# Patient Record
Sex: Female | Born: 1985 | Race: Black or African American | Hispanic: No | Marital: Married
Health system: Midwestern US, Community
[De-identification: ages and names within clinical notes are randomized; demographics above are authoritative.]

## PROBLEM LIST (undated history)

## (undated) DIAGNOSIS — J45998 Other asthma: Secondary | ICD-10-CM

## (undated) DIAGNOSIS — T7840XA Allergy, unspecified, initial encounter: Secondary | ICD-10-CM

## (undated) HISTORY — DX: Allergy, unspecified, initial encounter: T78.40XA

## (undated) HISTORY — PX: HERNIA REPAIR: SHX51

---

## 2007-01-27 ENCOUNTER — Emergency Department (HOSPITAL_COMMUNITY): Admission: EM | Admit: 2007-01-27 | Discharge: 2007-01-27 | Payer: Self-pay | Admitting: Family Medicine

## 2009-01-19 ENCOUNTER — Emergency Department (HOSPITAL_COMMUNITY): Admission: EM | Admit: 2009-01-19 | Discharge: 2009-01-19 | Payer: Self-pay | Admitting: Emergency Medicine

## 2009-07-07 ENCOUNTER — Emergency Department (HOSPITAL_COMMUNITY): Admission: EM | Admit: 2009-07-07 | Discharge: 2009-07-08 | Payer: Self-pay | Admitting: Emergency Medicine

## 2010-10-12 ENCOUNTER — Emergency Department (HOSPITAL_COMMUNITY)
Admission: EM | Admit: 2010-10-12 | Discharge: 2010-10-13 | Disposition: A | Discharge status: Home / Self Care | Payer: Self-pay | Attending: Emergency Medicine | Admitting: Emergency Medicine

## 2010-10-12 DIAGNOSIS — O99891 Other specified diseases and conditions complicating pregnancy: Secondary | ICD-10-CM | POA: Insufficient documentation

## 2010-10-12 DIAGNOSIS — R319 Hematuria, unspecified: Secondary | ICD-10-CM | POA: Insufficient documentation

## 2010-10-12 DIAGNOSIS — M549 Dorsalgia, unspecified: Secondary | ICD-10-CM | POA: Insufficient documentation

## 2010-10-12 DIAGNOSIS — N390 Urinary tract infection, site not specified: Secondary | ICD-10-CM | POA: Insufficient documentation

## 2010-10-12 DIAGNOSIS — O2390 Unspecified genitourinary tract infection in pregnancy, unspecified trimester: Secondary | ICD-10-CM | POA: Insufficient documentation

## 2010-10-12 LAB — URINALYSIS, ROUTINE W REFLEX MICROSCOPIC
Bilirubin Urine: NEGATIVE
Ketones, ur: NEGATIVE mg/dL
Nitrite: NEGATIVE
Specific Gravity, Urine: 1.02 (ref 1.005–1.030)

## 2010-10-12 LAB — URINE MICROSCOPIC-ADD ON

## 2010-10-12 LAB — WET PREP, GENITAL: Clue Cells Wet Prep HPF POC: NONE SEEN

## 2010-10-12 LAB — PREGNANCY, URINE: Preg Test, Ur: POSITIVE

## 2010-10-13 ENCOUNTER — Emergency Department (HOSPITAL_COMMUNITY): Payer: Self-pay

## 2010-10-13 LAB — GC/CHLAMYDIA PROBE AMP, GENITAL
Chlamydia, DNA Probe: NEGATIVE
GC Probe Amp, Genital: NEGATIVE

## 2010-10-14 LAB — URINE CULTURE

## 2011-05-05 HISTORY — PX: OTHER SURGICAL HISTORY: SHX169

## 2013-09-19 NOTE — Other (Signed)
HPI Comments: Struck right head against table 12 hours ago    Patient is a 28 y.o. female presenting with skin laceration. The history is provided by the patient.   Laceration   The incident occurred 12 to 24 hours ago. The laceration is located on the face. The laceration is 1 cm in size. The injury mechanism is a blunt object. Foreign body present: no. The pain is mild. The pain has been improving since onset. Pertinent negatives include no numbness, no tingling, no weakness, no loss of motion, no coolness and no discoloration. The patient's last tetanus shot was less than 5 years ago.       No past medical history on file.     No past surgical history on file.      No family history on file.     History     Social History   ??? Marital Status: MARRIED     Spouse Name: N/A     Number of Children: N/A   ??? Years of Education: N/A     Occupational History   ??? Not on file.     Social History Main Topics   ??? Smoking status: Not on file   ??? Smokeless tobacco: Not on file   ??? Alcohol Use: Not on file   ??? Drug Use: Not on file   ??? Sexual Activity: Not on file     Other Topics Concern   ??? Not on file     Social History Narrative   ??? No narrative on file                ALLERGIES: Shellfish derived    Review of Systems   Constitutional: Negative for fever and chills.   Skin: Positive for wound.   Neurological: Negative for tingling, weakness and numbness.       Filed Vitals:    09/19/13 1704   BP: 129/69   Pulse: 65   Temp: 98.8 ??F (37.1 ??C)   Resp: 18   Weight: 90.719 kg (200 lb)   SpO2: 99%       Physical Exam   Constitutional: She appears well-developed and well-nourished. No distress.   Neurological: She is alert.   Skin: Laceration noted. She is not diaphoretic.        Psychiatric: She has a normal mood and affect. Her behavior is normal. Judgment and thought content normal.   Nursing note and vitals reviewed.      MDM    Procedures

## 2013-09-19 NOTE — Other (Signed)
Wound cleaned and steri strips applied

## 2013-09-19 NOTE — Other (Signed)
TD <5 yrs per pt.

## 2014-07-04 ENCOUNTER — Emergency Department (HOSPITAL_COMMUNITY): Payer: Managed Care, Other (non HMO)

## 2014-07-04 ENCOUNTER — Emergency Department (HOSPITAL_COMMUNITY)
Admission: EM | Admit: 2014-07-04 | Discharge: 2014-07-04 | Disposition: A | Discharge status: Home / Self Care | Payer: Managed Care, Other (non HMO) | Attending: Emergency Medicine | Admitting: Emergency Medicine

## 2014-07-04 ENCOUNTER — Encounter (HOSPITAL_COMMUNITY): Payer: Self-pay | Admitting: Emergency Medicine

## 2014-07-04 DIAGNOSIS — R0602 Shortness of breath: Secondary | ICD-10-CM | POA: Diagnosis present

## 2014-07-04 DIAGNOSIS — J45901 Unspecified asthma with (acute) exacerbation: Secondary | ICD-10-CM | POA: Diagnosis not present

## 2014-07-04 DIAGNOSIS — R2240 Localized swelling, mass and lump, unspecified lower limb: Secondary | ICD-10-CM | POA: Insufficient documentation

## 2014-07-04 DIAGNOSIS — Z79899 Other long term (current) drug therapy: Secondary | ICD-10-CM | POA: Diagnosis not present

## 2014-07-04 DIAGNOSIS — R0682 Tachypnea, not elsewhere classified: Secondary | ICD-10-CM | POA: Diagnosis not present

## 2014-07-04 DIAGNOSIS — J9801 Acute bronchospasm: Secondary | ICD-10-CM

## 2014-07-04 HISTORY — DX: Other asthma: J45.998

## 2014-07-04 LAB — CBC WITH DIFFERENTIAL/PLATELET
BASOS ABS: 0 10*3/uL (ref 0.0–0.1)
Basophils Relative: 0 % (ref 0–1)
Eosinophils Absolute: 0.1 10*3/uL (ref 0.0–0.7)
Eosinophils Relative: 4 % (ref 0–5)
HCT: 43.7 % (ref 36.0–46.0)
HEMOGLOBIN: 14.5 g/dL (ref 12.0–15.0)
LYMPHS ABS: 2 10*3/uL (ref 0.7–4.0)
LYMPHS PCT: 51 % — AB (ref 12–46)
MCH: 27.3 pg (ref 26.0–34.0)
MCHC: 33.2 g/dL (ref 30.0–36.0)
MCV: 82.3 fL (ref 78.0–100.0)
MONOS PCT: 4 % (ref 3–12)
Monocytes Absolute: 0.2 10*3/uL (ref 0.1–1.0)
NEUTROS ABS: 1.6 10*3/uL — AB (ref 1.7–7.7)
NEUTROS PCT: 41 % — AB (ref 43–77)
Platelets: 196 10*3/uL (ref 150–400)
RBC: 5.31 MIL/uL — AB (ref 3.87–5.11)
RDW: 12.7 % (ref 11.5–15.5)
WBC: 3.9 10*3/uL — ABNORMAL LOW (ref 4.0–10.5)

## 2014-07-04 LAB — BASIC METABOLIC PANEL
ANION GAP: 9 (ref 5–15)
BUN: 15 mg/dL (ref 6–23)
CHLORIDE: 106 mmol/L (ref 96–112)
CO2: 23 mmol/L (ref 19–32)
Calcium: 9.5 mg/dL (ref 8.4–10.5)
Creatinine, Ser: 0.82 mg/dL (ref 0.50–1.10)
GFR calc Af Amer: >90 mL/min (ref >90)
GFR calc non Af Amer: >90 mL/min (ref >90)
Glucose, Bld: 80 mg/dL (ref 70–99)
Potassium: 3.3 mmol/L — ABNORMAL LOW (ref 3.5–5.1)
SODIUM: 138 mmol/L (ref 135–145)

## 2014-07-04 LAB — BRAIN NATRIURETIC PEPTIDE: B NATRIURETIC PEPTIDE 5: 20 pg/mL (ref 0.0–100.0)

## 2014-07-04 LAB — D-DIMER, QUANTITATIVE (NOT AT ARMC): D DIMER QUANT: 0.56 ug{FEU}/mL — AB (ref 0.00–0.48)

## 2014-07-04 LAB — PREGNANCY, URINE: Preg Test, Ur: NEGATIVE

## 2014-07-04 MED ORDER — PREDNISONE 50 MG PO TABS: ORAL_TABLET | ORAL | Status: DC

## 2014-07-04 MED ORDER — PREDNISONE 10 MG PO TABS
60.0000 mg | ORAL_TABLET | Freq: Once | ORAL | Status: AC
  Administered 2014-07-04: 60 mg via ORAL
  Filled 2014-07-04 (×2): qty 1

## 2014-07-04 MED ORDER — IOHEXOL 350 MG/ML SOLN
100.0000 mL | Freq: Once | INTRAVENOUS | Status: AC | PRN
  Administered 2014-07-04: 100 mL via INTRAVENOUS

## 2014-07-04 MED ORDER — IPRATROPIUM-ALBUTEROL 0.5-2.5 (3) MG/3ML IN SOLN
3.0000 mL | Freq: Once | RESPIRATORY_TRACT | Status: AC
  Administered 2014-07-04: 3 mL via RESPIRATORY_TRACT
  Filled 2014-07-04: qty 3

## 2014-07-04 MED ORDER — ALBUTEROL SULFATE HFA 108 (90 BASE) MCG/ACT IN AERS: 2.0000 | INHALATION_SPRAY | Freq: Four times a day (QID) | RESPIRATORY_TRACT | Status: DC | PRN

## 2014-07-04 NOTE — ED Provider Notes (Signed)
CSN: [id number]     Arrival date & time 07/04/14  1317 History   First MD Initiated Contact with Patient 07/04/14 1328     Chief Complaint  Patient presents with  . Shortness of Breath     (Consider location/radiation/quality/duration/timing/severity/associated sxs/prior Treatment) HPI Comments: Obese female with history of "seasonal asthma" nonsmoker presenting or shortness of breath that worsened this morning while she was picking up her son from school. She feels like she can't catch her breath has chest tightness,.. Her husband at bedside states she's been having difficulty breathing for the past 3 days especially at night which patient denies and says she feels fine during the day. She is dyspneic with conversation but in no distress. She denies any recent URI type symptoms but has had a dry cough. Denies any abdominal pain nausea or vomiting. Reports no cardiac history. She is on Mirena birth control. She has never been hospitalized for asthma and does not use any asthma medications at home.  The history is provided by the spouse and the patient. The history is limited by the condition of the patient.    Past Medical History  Diagnosis Date  . Seasonal asthma    Past Surgical History  Procedure Laterality Date  . Hernia repair     Family History  Problem Relation Age of Onset  . Cancer Other    History  Substance Use Topics  . Smoking status: Never Smoker   . Smokeless tobacco: Never Used  . Alcohol Use: No   OB History    Gravida Para Term Preterm AB TAB SAB Ectopic Multiple Living   Review of Systems  Constitutional: Negative for fever, activity change and appetite change.  Eyes: Negative for visual disturbance.  Respiratory: Positive for cough, chest tightness and shortness of breath.   Cardiovascular: Positive for leg swelling. Negative for chest pain.  Gastrointestinal: Negative for nausea, vomiting and abdominal pain.  Genitourinary: Negative  for dysuria and hematuria.  Musculoskeletal: Negative for myalgias and arthralgias.  Neurological: Negative for dizziness, weakness, light-headedness and headaches.  A complete 10 system review of systems was obtained and all systems are negative except as noted in the HPI and PMH.      Allergies  Shellfish allergy  Home Medications   Prior to Admission medications   Medication Sig Start Date End Date Taking? Authorizing Provider  levonorgestrel (MIRENA) 20 MCG/24HR IUD 1 each by Intrauterine route once.   Yes Historical Provider, MD  albuterol (PROVENTIL HFA;VENTOLIN HFA) 108 (90 BASE) MCG/ACT inhaler Inhale 2 puffs into the lungs every 6 (six) hours as needed for wheezing or shortness of breath. 07/04/14   Glynn Octave, MD  predniSONE (DELTASONE) 50 MG tablet 1 tablet PO daily 07/04/14   Glynn Octave, MD   BP 109/77 mmHg  Pulse 55  Temp(Src) 97.9 F (36.6 C) (Oral)  Resp 14  Ht  (1.575 m)  Wt 200 lb (90.719 kg)  BMI 36.57 kg/m2  SpO2 100%  LMP 06/20/2014 Physical Exam  Constitutional: She is oriented to person, place, and time. She appears well-developed and well-nourished. She appears distressed.  Tachypnea, increased work of breathing, dyspneic with conversation, speaking in short phrases  HENT:  Head: Normocephalic and atraumatic.  Mouth/Throat: Oropharynx is clear and moist. No oropharyngeal exudate.  Eyes: Conjunctivae and EOM are normal. Pupils are equal, round, and reactive to light.  Neck: Normal range of motion. Neck supple.  No meningismus.  Cardiovascular: Normal rate, regular rhythm, normal heart sounds and intact distal pulses.   No murmur heard. Pulmonary/Chest: Effort normal and breath sounds normal. No respiratory distress. She has no wheezes.  Good air exchange, no wheezing appreciated  Abdominal: Soft. There is no tenderness. There is no rebound and no guarding.  Musculoskeletal: Normal range of motion. She exhibits edema. She exhibits no  tenderness.  Trace pedal edema  Neurological: She is alert and oriented to person, place, and time. No cranial nerve deficit. She exhibits normal muscle tone. Coordination normal.  No ataxia on finger to nose bilaterally. No pronator drift. 5/5 strength throughout. CN 2-12 intact. Negative Romberg. Equal grip strength. Sensation intact. Gait is normal.   Skin: Skin is warm.  Psychiatric: She has a normal mood and affect. Her behavior is normal.  Nursing note and vitals reviewed.   ED Course  Procedures (including critical care time) Labs Review Labs Reviewed  CBC WITH DIFFERENTIAL/PLATELET - Abnormal; Notable for the following:    WBC 3.9 (*)    RBC 5.31 (*)    Neutrophils Relative % 41 (*)    Neutro Abs 1.6 (*)    Lymphocytes Relative 51 (*)    All other components within normal limits  BASIC METABOLIC PANEL - Abnormal; Notable for the following:    Potassium 3.3 (*)    All other components within normal limits  D-DIMER, QUANTITATIVE - Abnormal; Notable for the following:    D-Dimer, Quant 0.56 (*)    All other components within normal limits  BRAIN NATRIURETIC PEPTIDE  PREGNANCY, URINE    Imaging Review Dg Chest 2 View  07/04/2014   CLINICAL DATA:  Shortness of breath.  EXAM: CHEST - 2 VIEW  COMPARISON:  07/07/2009  FINDINGS: The heart size and mediastinal contours are within normal limits. There is no evidence of pulmonary edema, consolidation, pneumothorax, nodule or pleural fluid. The visualized skeletal structures are unremarkable.  IMPRESSION: No active disease.   Electronically Signed   By: Irish Lack M.D.   On: 07/04/2014 15:03   Ct Angio Chest Pe W/cm &/or Wo Cm  07/04/2014   CLINICAL DATA:  Shortness of breath for 3 days.  EXAM: CT ANGIOGRAPHY CHEST WITH CONTRAST  TECHNIQUE: Multidetector CT imaging of the chest was performed using the standard protocol during bolus administration of intravenous contrast. Multiplanar CT image reconstructions and MIPs were obtained  to evaluate the vascular anatomy.  CONTRAST:  OMNIPAQUE IOHEXOL 350 MG/ML SOLN  COMPARISON:  Chest x-ray dated 07/04/2014  FINDINGS: There are no pulmonary emboli, infiltrates, or effusions. Minimal linear atelectasis in the superior aspect of the right middle lobe. The lungs are otherwise clear. No effusions. No osseous abnormality. Heart size and pulmonary vascularity are normal.  No osseous abnormality. The visualized portion of the upper abdomen is normal.  Review of the MIP images confirms the above findings.  IMPRESSION: Tiny area of atelectasis in the right middle lobe. Otherwise, normal exam. No pulmonary emboli.   Electronically Signed   By: Francene Boyers M.D.   On: 07/04/2014 18:35     EKG Interpretation   Date/Time:  Friday July 04 2014 17:17:49 EDT Ventricular Rate:  63 PR Interval:  150 QRS Duration: 83 QT Interval:  396 QTC Calculation: 405 R Axis:   82 Text Interpretation:  Sinus arrhythmia No significant change was found  Confirmed by Manus Gunning  MD, Thora Scherman [phone]) on 07/04/2014 5:20:02 PM      MDM   Final diagnoses:  Bronchospasm   Dyspnea without wheezing. Oxygenation 100%. Lungs are clear.  EKG, chest x-ray, d-dimer Nebulizer for symptomatic treatment  Patient feels improved after nebulizer. Work of breathing improved, no hypoxia. Chest x-ray negative.  D-dimer is positive in setting of birth control use.  CT negative for PE. Work of breathing has improved. No hypoxia with ambulation.  Treat for bronchospasm and possible asthma exacerbation with nebulizers and steroids. Follow up with PCP. Return precautions discussed.  Glynn OctaveStephen Jabreel Chimento, MD 07/04/14 2156

## 2014-07-04 NOTE — ED Notes (Signed)
Pt ambulated well, with no distress. HR:91 02:100%

## 2014-07-04 NOTE — Discharge Instructions (Signed)
Bronchospasm Use the inhaler and steroids as prescribed. Followup with your doctor. Return to the ED if you develop new or worsening symptoms. A bronchospasm is a spasm or tightening of the airways going into the lungs. During a bronchospasm breathing becomes more difficult because the airways get smaller. When this happens there can be coughing, a whistling sound when breathing (wheezing), and difficulty breathing. Bronchospasm is often associated with asthma, but not all patients who experience a bronchospasm have asthma. CAUSES  A bronchospasm is caused by inflammation or irritation of the airways. The inflammation or irritation may be triggered by:   Allergies (such as to animals, pollen, food, or mold). Allergens that cause bronchospasm may cause wheezing immediately after exposure or many hours later.   Infection. Viral infections are believed to be the most common cause of bronchospasm.   Exercise.   Irritants (such as pollution, cigarette smoke, strong odors, aerosol sprays, and paint fumes).   Weather changes. Winds increase molds and pollens in the air. Rain refreshes the air by washing irritants out. Cold air may cause inflammation.   Stress and emotional upset.  SIGNS AND SYMPTOMS   Wheezing.   Excessive nighttime coughing.   Frequent or severe coughing with a simple cold.   Chest tightness.   Shortness of breath.  DIAGNOSIS  Bronchospasm is usually diagnosed through a history and physical exam. Tests, such as chest X-rays, are sometimes done to look for other conditions. TREATMENT   Inhaled medicines can be given to open up your airways and help you breathe. The medicines can be given using either an inhaler or a nebulizer machine.  Corticosteroid medicines may be given for severe bronchospasm, usually when it is associated with asthma. HOME CARE INSTRUCTIONS   Always have a plan prepared for seeking medical care. Know when to call your health care  provider and local emergency services (911 in the [place] Know where you can access local emergency care.  Only take medicines as directed by your health care provider.  If you were prescribed an inhaler or nebulizer machine, ask your health care provider to explain how to use it correctly. Always use a spacer with your inhaler if you were given one.  It is necessary to remain calm during an attack. Try to relax and breathe more slowly.  Control your home environment in the following ways:   Change your heating and air conditioning filter at least once a month.   Limit your use of fireplaces and wood stoves.  Do not smoke and do not allow smoking in your home.   Avoid exposure to perfumes and fragrances.   Get rid of pests (such as roaches and mice) and their droppings.   Throw away plants if you see mold on them.   Keep your house clean and dust free.   Replace carpet with wood, tile, or vinyl flooring. Carpet can trap dander and dust.   Use allergy-proof pillows, mattress covers, and box spring covers.   Wash bed sheets and blankets every week in hot water and dry them in a dryer.   Use blankets that are made of polyester or cotton.   Wash hands frequently. SEEK MEDICAL CARE IF:   You have muscle aches.   You have chest pain.   The sputum changes from clear or white to yellow, green, gray, or bloody.   The sputum you cough up gets thicker.   There are problems that may be related to the medicine you are given, such as  a rash, itching, swelling, or trouble breathing.  SEEK IMMEDIATE MEDICAL CARE IF:   You have worsening wheezing and coughing even after taking your prescribed medicines.   You have increased difficulty breathing.   You develop severe chest pain. MAKE SURE YOU:   Understand these instructions.  Will watch your condition.  Will get help right away if you are not doing well or get worse. Document Released: 03/03/2003 Document  Revised: 03/05/2013 Document Reviewed: 08/20/2012 [facility] Patient Information 2015 [place], [place]. This information is not intended to replace advice given to you by your health care provider. Make sure you discuss any questions you have with your health care provider.

## 2014-07-04 NOTE — ED Notes (Signed)
Pt c/o SOB that began 3 days ago. Pt states she has seasonal asthma.

## 2014-08-29 ENCOUNTER — Emergency Department (HOSPITAL_COMMUNITY)
Admission: EM | Admit: 2014-08-29 | Discharge: 2014-08-29 | Disposition: A | Discharge status: Home / Self Care | Payer: Managed Care, Other (non HMO) | Attending: Emergency Medicine | Admitting: Emergency Medicine

## 2014-08-29 ENCOUNTER — Emergency Department (HOSPITAL_COMMUNITY): Payer: Managed Care, Other (non HMO)

## 2014-08-29 ENCOUNTER — Encounter (HOSPITAL_COMMUNITY): Payer: Self-pay | Admitting: *Deleted

## 2014-08-29 DIAGNOSIS — S86812A Strain of other muscle(s) and tendon(s) at lower leg level, left leg, initial encounter: Secondary | ICD-10-CM | POA: Insufficient documentation

## 2014-08-29 DIAGNOSIS — Y929 Unspecified place or not applicable: Secondary | ICD-10-CM | POA: Insufficient documentation

## 2014-08-29 DIAGNOSIS — Z79899 Other long term (current) drug therapy: Secondary | ICD-10-CM | POA: Diagnosis not present

## 2014-08-29 DIAGNOSIS — X58XXXA Exposure to other specified factors, initial encounter: Secondary | ICD-10-CM | POA: Insufficient documentation

## 2014-08-29 DIAGNOSIS — Z7952 Long term (current) use of systemic steroids: Secondary | ICD-10-CM | POA: Insufficient documentation

## 2014-08-29 DIAGNOSIS — S86119A Strain of other muscle(s) and tendon(s) of posterior muscle group at lower leg level, unspecified leg, initial encounter: Secondary | ICD-10-CM

## 2014-08-29 DIAGNOSIS — S86811A Strain of other muscle(s) and tendon(s) at lower leg level, right leg, initial encounter: Secondary | ICD-10-CM | POA: Diagnosis not present

## 2014-08-29 DIAGNOSIS — M722 Plantar fascial fibromatosis: Secondary | ICD-10-CM | POA: Insufficient documentation

## 2014-08-29 DIAGNOSIS — J45909 Unspecified asthma, uncomplicated: Secondary | ICD-10-CM | POA: Diagnosis not present

## 2014-08-29 DIAGNOSIS — Y999 Unspecified external cause status: Secondary | ICD-10-CM | POA: Diagnosis not present

## 2014-08-29 DIAGNOSIS — Y939 Activity, unspecified: Secondary | ICD-10-CM | POA: Insufficient documentation

## 2014-08-29 DIAGNOSIS — M79672 Pain in left foot: Secondary | ICD-10-CM | POA: Diagnosis present

## 2014-08-29 LAB — I-STAT CHEM 8, ED
BUN: 16 mg/dL (ref 6–20)
CHLORIDE: 104 mmol/L (ref 101–111)
Calcium, Ion: 1.27 mmol/L — ABNORMAL HIGH (ref 1.12–1.23)
Creatinine, Ser: 0.8 mg/dL (ref 0.44–1.00)
Glucose, Bld: 103 mg/dL — ABNORMAL HIGH (ref 65–99)
HCT: 41 % (ref 36.0–46.0)
Hemoglobin: 13.9 g/dL (ref 12.0–15.0)
POTASSIUM: 3.7 mmol/L (ref 3.5–5.1)
Sodium: 140 mmol/L (ref 135–145)
TCO2: 22 mmol/L (ref 0–100)

## 2014-08-29 MED ORDER — IBUPROFEN 600 MG PO TABS
600.0000 mg | ORAL_TABLET | Freq: Four times a day (QID) | ORAL | Status: DC | PRN
Start: 2014-08-29 — End: 2015-03-20

## 2014-08-29 NOTE — ED Notes (Signed)
Pt c/o pain to bilateral feet that shoots up both legs. Pt states when she stands, her ankles burn. Pt has noticed some swelling in her ankles.

## 2014-08-29 NOTE — Discharge Instructions (Signed)
Muscle Strain A muscle strain is an injury that occurs when a muscle is stretched beyond its normal length. Usually a small number of muscle fibers are torn when this happens. Muscle strain is rated in degrees. First-degree strains have the least amount of muscle fiber tearing and pain. Second-degree and third-degree strains have increasingly more tearing and pain.  Usually, recovery from muscle strain takes 1-2 weeks. Complete healing takes 5-6 weeks.  CAUSES  Muscle strain happens when a sudden, violent force placed on a muscle stretches it too far. This may occur with lifting, sports, or a fall.  RISK FACTORS Muscle strain is especially common in athletes.  SIGNS AND SYMPTOMS At the site of the muscle strain, there may be:  Pain.  Bruising.  Swelling.  Difficulty using the muscle due to pain or lack of normal function. DIAGNOSIS  Your health care provider will perform a physical exam and ask about your medical history. TREATMENT  Often, the best treatment for a muscle strain is resting, icing, and applying cold compresses to the injured area.  HOME CARE INSTRUCTIONS   Use the PRICE method of treatment to promote muscle healing during the first 2-3 days after your injury. The PRICE method involves:  Protecting the muscle from being injured again.  Restricting your activity and resting the injured body part.  Icing your injury. To do this, put ice in a plastic bag. Place a towel between your skin and the bag. Then, apply the ice and leave it on from 15-20 minutes each hour. After the third day, switch to moist heat packs.  Apply compression to the injured area with a splint or elastic bandage. Be careful not to wrap it too tightly. This may interfere with blood circulation or increase swelling.  Elevate the injured body part above the level of your heart as often as you can.  Only take over-the-counter or prescription medicines for pain, discomfort, or fever as directed by your  health care provider.  Warming up prior to exercise helps to prevent future muscle strains. SEEK MEDICAL CARE IF:   You have increasing pain or swelling in the injured area.  You have numbness, tingling, or a significant loss of strength in the injured area. MAKE SURE YOU:   Understand these instructions.  Will watch your condition.  Will get help right away if you are not doing well or get worse. Document Released: 02/28/2005 Document Revised: 12/19/2012 Document Reviewed: 09/27/2012 [facility] Patient Information 2015 [place], [place]. This information is not intended to replace advice given to you by your health care provider. Make sure you discuss any questions you have with your health care provider.  Plantar Fasciitis (Heel Spur Syndrome) with Rehab The plantar fascia is a fibrous, ligament-like, soft-tissue structure that spans the bottom of the foot. Plantar fasciitis is a condition that causes pain in the foot due to inflammation of the tissue. SYMPTOMS   Pain and tenderness on the underneath side of the foot.  Pain that worsens with standing or walking. CAUSES  Plantar fasciitis is caused by irritation and injury to the plantar fascia on the underneath side of the foot. Common mechanisms of injury include:  Direct trauma to bottom of the foot.  Damage to a small nerve that runs under the foot where the main fascia attaches to the heel bone.  Stress placed on the plantar fascia due to bone spurs. RISK INCREASES WITH:   Activities that place stress on the plantar fascia (running, jumping, pivoting, or cutting).  Poor strength and flexibility.  Improperly fitted shoes.  Tight calf muscles.  Flat feet.  Failure to warm-up properly before activity.  Obesity. PREVENTION  Warm up and stretch properly before activity.  Allow for adequate recovery between workouts.  Maintain physical fitness:  Strength, flexibility, and endurance.  Cardiovascular  fitness.  Maintain a health body weight.  Avoid stress on the plantar fascia.  Wear properly fitted shoes, including arch supports for individuals who have flat feet. PROGNOSIS  If treated properly, then the symptoms of plantar fasciitis usually resolve without surgery. However, occasionally surgery is necessary. RELATED COMPLICATIONS   Recurrent symptoms that may result in a chronic condition.  Problems of the lower back that are caused by compensating for the injury, such as limping.  Pain or weakness of the foot during push-off following surgery.  Chronic inflammation, scarring, and partial or complete fascia tear, occurring more often from repeated injections. TREATMENT  Treatment initially involves the use of ice and medication to help reduce pain and inflammation. The use of strengthening and stretching exercises may help reduce pain with activity, especially stretches of the Achilles tendon. These exercises may be performed at home or with a therapist. Your caregiver may recommend that you use heel cups of arch supports to help reduce stress on the plantar fascia. Occasionally, corticosteroid injections are given to reduce inflammation. If symptoms persist for greater than 6 months despite non-surgical (conservative), then surgery may be recommended.  MEDICATION   If pain medication is necessary, then nonsteroidal anti-inflammatory medications, such as aspirin and ibuprofen, or other minor pain relievers, such as acetaminophen, are often recommended.  Do not take pain medication within 7 days before surgery.  Prescription pain relievers may be given if deemed necessary by your caregiver. Use only as directed and only as much as you need.  Corticosteroid injections may be given by your caregiver. These injections should be reserved for the most serious cases, because they may only be given a certain number of times. HEAT AND COLD  Cold treatment (icing) relieves pain and reduces  inflammation. Cold treatment should be applied for 10 to 15 minutes every 2 to 3 hours for inflammation and pain and immediately after any activity that aggravates your symptoms. Use ice packs or massage the area with a piece of ice (ice massage).  Heat treatment may be used prior to performing the stretching and strengthening activities prescribed by your caregiver, physical therapist, or athletic trainer. Use a heat pack or soak the injury in warm water. SEEK IMMEDIATE MEDICAL CARE IF:  Treatment seems to offer no benefit, or the condition worsens.  Any medications produce adverse side effects. EXERCISES RANGE OF MOTION (ROM) AND STRETCHING EXERCISES - Plantar Fasciitis (Heel Spur Syndrome) These exercises may help you when beginning to rehabilitate your injury. Your symptoms may resolve with or without further involvement from your physician, physical therapist or athletic trainer. While completing these exercises, remember:   Restoring tissue flexibility helps normal motion to return to the joints. This allows healthier, less painful movement and activity.  An effective stretch should be held for at least 30 seconds.  A stretch should never be painful. You should only feel a gentle lengthening or release in the stretched tissue. RANGE OF MOTION - Toe Extension, Flexion  Sit with your right / left leg crossed over your opposite knee.  Grasp your toes and gently pull them back toward the top of your foot. You should feel a stretch on the bottom of your toes  and/or foot.  Hold this stretch for __________ seconds.  Now, gently pull your toes toward the bottom of your foot. You should feel a stretch on the top of your toes and or foot.  Hold this stretch for __________ seconds. Repeat __________ times. Complete this stretch __________ times per day.  RANGE OF MOTION - Ankle Dorsiflexion, Active Assisted  Remove shoes and sit on a chair that is preferably not on a carpeted  surface.  Place right / left foot under knee. Extend your opposite leg for support.  Keeping your heel down, slide your right / left foot back toward the chair until you feel a stretch at your ankle or calf. If you do not feel a stretch, slide your bottom forward to the edge of the chair, while still keeping your heel down.  Hold this stretch for __________ seconds. Repeat __________ times. Complete this stretch __________ times per day.  STRETCH - Gastroc, Standing  Place hands on wall.  Extend right / left leg, keeping the front knee somewhat bent.  Slightly point your toes inward on your back foot.  Keeping your right / left heel on the floor and your knee straight, shift your weight toward the wall, not allowing your back to arch.  You should feel a gentle stretch in the right / left calf. Hold this position for __________ seconds. Repeat __________ times. Complete this stretch __________ times per day. STRETCH - Soleus, Standing  Place hands on wall.  Extend right / left leg, keeping the other knee somewhat bent.  Slightly point your toes inward on your back foot.  Keep your right / left heel on the floor, bend your back knee, and slightly shift your weight over the back leg so that you feel a gentle stretch deep in your back calf.  Hold this position for __________ seconds. Repeat __________ times. Complete this stretch __________ times per day. STRETCH - Gastrocsoleus, Standing  Note: This exercise can place a lot of stress on your foot and ankle. Please complete this exercise only if specifically instructed by your caregiver.   Place the ball of your right / left foot on a step, keeping your other foot firmly on the same step.  Hold on to the wall or a rail for balance.  Slowly lift your other foot, allowing your body weight to press your heel down over the edge of the step.  You should feel a stretch in your right / left calf.  Hold this position for __________  seconds.  Repeat this exercise with a slight bend in your right / left knee. Repeat __________ times. Complete this stretch __________ times per day.  STRENGTHENING EXERCISES - Plantar Fasciitis (Heel Spur Syndrome)  These exercises may help you when beginning to rehabilitate your injury. They may resolve your symptoms with or without further involvement from your physician, physical therapist or athletic trainer. While completing these exercises, remember:   Muscles can gain both the endurance and the strength needed for everyday activities through controlled exercises.  Complete these exercises as instructed by your physician, physical therapist or athletic trainer. Progress the resistance and repetitions only as guided. STRENGTH - Towel Curls  Sit in a chair positioned on a non-carpeted surface.  Place your foot on a towel, keeping your heel on the floor.  Pull the towel toward your heel by only curling your toes. Keep your heel on the floor.  If instructed by your physician, physical therapist or athletic trainer, add ____________________ at the  end of the towel. Repeat __________ times. Complete this exercise __________ times per day. STRENGTH - Ankle Inversion  Secure one end of a rubber exercise band/tubing to a fixed object (table, pole). Loop the other end around your foot just before your toes.  Place your fists between your knees. This will focus your strengthening at your ankle.  Slowly, pull your big toe up and in, making sure the band/tubing is positioned to resist the entire motion.  Hold this position for __________ seconds.  Have your muscles resist the band/tubing as it slowly pulls your foot back to the starting position. Repeat __________ times. Complete this exercises __________ times per day.  Document Released: 02/28/2005 Document Revised: 05/23/2011 Document Reviewed: 06/12/2008 [facility] Patient Information 2015 [place], [place]. This information is not  intended to replace advice given to you by your health care provider. Make sure you discuss any questions you have with your health care provider.

## 2014-08-29 NOTE — ED Notes (Signed)
bil foot pain for 4 weeks, no known injury, burning sensation in feet that radiates up back of calf on rt leg.  Good dp pulses. Increased pain with wt bearing.

## 2014-08-31 NOTE — ED Provider Notes (Signed)
CSN: [id number]     Arrival date & time 08/29/14  2120 History   First MD Initiated Contact with Patient 08/29/14 2137     Chief Complaint  Patient presents with  . Foot Pain     (Consider location/radiation/quality/duration/timing/severity/associated sxs/prior Treatment) The history is provided by the patient.   Kim White is a 29 y.o. female presenting with bilateral lower extremity pain. She describes a burning sensation in both her feet along with pain beneath her left heel and soreness that radiates up both posterior legs ending in her mid calf area.  These symptoms started several days after starting an exercise program in which she walks on an inclined treadmill for 15 minutes daily.  She denies calf swelling, redness, spasms or leg weakness.  She has taken no medicines for treatment of her symptoms.  Pain is improved with rest.      Past Medical History  Diagnosis Date  . Seasonal asthma    Past Surgical History  Procedure Laterality Date  . Hernia repair     Family History  Problem Relation Age of Onset  . Cancer Other    History  Substance Use Topics  . Smoking status: Never Smoker   . Smokeless tobacco: Never Used  . Alcohol Use: No   OB History    Gravida Para Term Preterm AB TAB SAB Ectopic Multiple Living   Review of Systems  Constitutional: Negative for fever.  Musculoskeletal: Positive for myalgias and arthralgias. Negative for joint swelling.  Neurological: Negative for weakness and numbness.      Allergies  Shellfish allergy  Home Medications   Prior to Admission medications   Medication Sig Start Date End Date Taking? Authorizing Provider  albuterol (PROVENTIL HFA;VENTOLIN HFA) 108 (90 BASE) MCG/ACT inhaler Inhale 2 puffs into the lungs every 6 (six) hours as needed for wheezing or shortness of breath. 07/04/14   Glynn Octave, MD  ibuprofen (ADVIL,MOTRIN) 600 MG tablet Take 1 tablet (600 mg total) by mouth every 6 (six)  hours as needed. 08/29/14   Burgess Amor, PA-C  levonorgestrel (MIRENA) 20 MCG/24HR IUD 1 each by Intrauterine route once.    Historical Provider, MD  predniSONE (DELTASONE) 50 MG tablet 1 tablet PO daily 07/04/14   Glynn Octave, MD   BP 108/71 mmHg  Pulse 62  Temp(Src) 98.4 F (36.9 C) (Oral)  Resp 20  Ht  (1.575 m)  Wt 201 lb (91.173 kg)  BMI 36.75 kg/m2  SpO2 100%  LMP  Physical Exam  Constitutional: She appears well-developed and well-nourished.  HENT:  Head: Atraumatic.  Neck: Normal range of motion.  Cardiovascular:  Pulses equal bilaterally  Musculoskeletal: She exhibits tenderness. She exhibits no edema.       Left foot: There is bony tenderness. There is normal range of motion, no swelling, normal capillary refill and no deformity.  Pt is point tender at the plantar fascia insertion site of calcaneous. No edema, no erythema, skin intact. Bilateral achilles tendons are nontender, intact, no palpable deformity or deficit.  Lower bilateral  gastrocnemius muscles are sore with palpation, no unusual edema, no induration. No red streaking.  No pain in the proximal musculature, knees or thighs.  Neurological: She is alert. She has normal strength. She displays normal reflexes. No sensory deficit.  Skin: Skin is warm and dry.  Psychiatric: She has a normal mood and affect.    ED Course  Procedures (  including critical care time) Labs Review Labs Reviewed  I-STAT CHEM 8, ED - Abnormal; Notable for the following:    Glucose, Bld 103 (*)    Calcium, Ion 1.27 (*)    All other components within normal limits    Imaging Review Dg Foot Complete Left  08/29/2014   CLINICAL DATA:  Increasing left foot pain for a month. No known injury.  EXAM: LEFT FOOT - COMPLETE 3+ VIEW  COMPARISON:  None.  FINDINGS: There is no evidence of fracture or dislocation. There is no evidence of arthropathy or other focal bone abnormality. Soft tissues are unremarkable.  IMPRESSION: Negative.    Electronically Signed   By: Burman Nieves M.D.   On: 08/29/2014 22:28     EKG Interpretation None      MDM   Final diagnoses:  Strain of gastrocnemius muscle, unspecified laterality, initial encounter  Plantar fasciitis of left foot    Patients labs and/or radiological studies were reviewed and considered during the medical decision making and disposition process.  Results were also discussed with patient.  Pt was advised to back off on current activity until sx resolve, then may perform this exercise qod allowing recovery of muscles. Ibuprofen prescribed prn.  Discussed shoe insert if heel continues to be painful, discussed exercises to improve the plantar fascia strain.  Referral to ortho for f/u care if sx persist or worsen.  Pt with bilateral lower extremity sx starting after exercise, not c/w dvt.  Doubt rhabdo given isolated sx.    Burgess Amor, PA-C 08/31/14 1343  Benjiman Core, MD 09/01/14 0005

## 2015-03-20 ENCOUNTER — Emergency Department (HOSPITAL_COMMUNITY)
Admission: EM | Admit: 2015-03-20 | Discharge: 2015-03-20 | Disposition: A | Discharge status: Home / Self Care | Payer: Managed Care, Other (non HMO) | Attending: Emergency Medicine | Admitting: Emergency Medicine

## 2015-03-20 ENCOUNTER — Encounter (HOSPITAL_COMMUNITY): Payer: Self-pay | Admitting: Emergency Medicine

## 2015-03-20 ENCOUNTER — Emergency Department (HOSPITAL_COMMUNITY): Payer: Managed Care, Other (non HMO)

## 2015-03-20 DIAGNOSIS — Z79899 Other long term (current) drug therapy: Secondary | ICD-10-CM | POA: Diagnosis not present

## 2015-03-20 DIAGNOSIS — M25462 Effusion, left knee: Secondary | ICD-10-CM | POA: Diagnosis not present

## 2015-03-20 DIAGNOSIS — Z7952 Long term (current) use of systemic steroids: Secondary | ICD-10-CM | POA: Insufficient documentation

## 2015-03-20 DIAGNOSIS — R2242 Localized swelling, mass and lump, left lower limb: Secondary | ICD-10-CM | POA: Diagnosis present

## 2015-03-20 DIAGNOSIS — Y998 Other external cause status: Secondary | ICD-10-CM | POA: Diagnosis not present

## 2015-03-20 DIAGNOSIS — M25562 Pain in left knee: Secondary | ICD-10-CM | POA: Diagnosis not present

## 2015-03-20 MED ORDER — HYDROCODONE-ACETAMINOPHEN 5-325 MG PO TABS: 2.0000 | ORAL_TABLET | ORAL | Status: DC | PRN

## 2015-03-20 MED ORDER — IBUPROFEN 600 MG PO TABS: 600.0000 mg | ORAL_TABLET | Freq: Four times a day (QID) | ORAL | Status: DC | PRN

## 2015-03-20 NOTE — ED Provider Notes (Signed)
CSN: [id number]     Arrival date & time 03/20/15  1646 History   First MD Initiated Contact with Patient 03/20/15 1658     Chief Complaint  Patient presents with  . Leg Swelling     (Consider location/radiation/quality/duration/timing/severity/associated sxs/prior Treatment) Patient is a 30 y.o. female presenting with knee pain. The history is provided by the patient. No language interpreter was used.  Knee Pain Location:  Knee Time since incident:  2 days Injury: no   Knee location:  L knee Pain details:    Quality:  Aching   Radiates to:  Does not radiate   Severity:  Moderate   Timing:  Constant   Progression:  Worsening Chronicity:  New Dislocation: no   Foreign body present:  No foreign bodies Relieved by:  None tried Worsened by:  Activity and bearing weight Ineffective treatments:  None tried Associated symptoms: swelling    Kim FickJamesha Delaughter is a 30 y.o. female who presents to the ED with pain and swelling of the left knee. She first noted the swelling earlier today. The pain and swelling is located in the medial aspect of the left knee. 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  She denies any other problems.   Past Medical History  Diagnosis Date  . Seasonal asthma    Past Surgical History  Procedure Laterality Date  . Hernia repair     Family History  Problem Relation Age of Onset  . Cancer Other    Social History  Substance Use Topics  . Smoking status: Never Smoker   . Smokeless tobacco: Never Used  . Alcohol Use: No   OB History    Gravida Para Term Preterm AB TAB SAB Ectopic Multiple Living   1 1 1             Review of Systems  Musculoskeletal: Positive for joint swelling and arthralgias.       Left knee pain   All other systems negataive   Allergies  Shellfish allergy  Home Medications   Prior to Admission medications   Medication Sig Start Date End Date Taking?  Authorizing Provider  albuterol (PROVENTIL HFA;VENTOLIN HFA) 108 (90 BASE) MCG/ACT inhaler Inhale 2 puffs into the lungs every 6 (six) hours as needed for wheezing or shortness of breath. 07/04/14   Glynn OctaveStephen Rancour, MD  HYDROcodone-acetaminophen (NORCO/VICODIN) 5-325 MG tablet Take 2 tablets by mouth every 4 (four) hours as needed. 03/20/15   Hope Orlene OchM Neese, NP  ibuprofen (ADVIL,MOTRIN) 600 MG tablet Take 1 tablet (600 mg total) by mouth every 6 (six) hours as needed. 03/20/15   Hope Orlene OchM Neese, NP  levonorgestrel (MIRENA) 20 MCG/24HR IUD 1 each by Intrauterine route once.    Historical Provider, MD  predniSONE (DELTASONE) 50 MG tablet 1 tablet PO daily 07/04/14   Glynn OctaveStephen Rancour, MD   BP 114/76 mmHg  Pulse 70  Temp(Src) 97.9 F (36.6 C) (Oral)  Resp 18  Ht 5\' 2"  (1.575 m)  Wt 91.627 kg  BMI 36.94 kg/m2  SpO2 100%  LMP 03/13/2015 (Exact Date) Physical Exam  Constitutional: She is oriented to person, place, and time. She appears well-developed and well-nourished. No distress.  HENT:  Head: Normocephalic and atraumatic.  Eyes: EOM are normal.  Neck: Neck supple.  Cardiovascular: Normal rate.   Pulmonary/Chest: Effort normal.  Musculoskeletal: Normal range of motion.       Left knee: She exhibits swelling (  minimal). She exhibits normal range of motion, no ecchymosis, no deformity, no laceration, no erythema, normal alignment and normal patellar mobility. Tenderness found. MCL tenderness noted.       Legs: Pedal pulses 2+, adequate circulation, no calf pain, no pain to the posterior knee. Full flexion and extension without difficulty. Straight leg raise without difficulty.   Neurological: She is alert and oriented to person, place, and time. No cranial nerve deficit.  Skin: Skin is warm and dry.  Psychiatric: She has a normal mood and affect. Her behavior is normal.  Nursing note and vitals reviewed.   ED Course  Procedures (including critical care time) Labs Review Labs Reviewed - No data to  display  Imaging Review Dg Knee Complete 4 Views Left  03/20/2015  CLINICAL DATA:  Medial side LEFT knee pain for 1 day EXAM: LEFT KNEE - COMPLETE 4+ VIEW COMPARISON:  None. FINDINGS: No fracture of the proximal tibia or distal femur. Patella is normal. No joint effusion. IMPRESSION: No acute osseous abnormality. Electronically Signed   By: Genevive Bi M.D.   On: 03/20/2015 18:22    MDM  30 y.o. female with left knee pain and swelling stable for d/c without fracture or dislocation noted on x-ray and no focal neuro deficits. Placed in knee immobilizer, crutches, ice, elevation, pain management and follow up with ortho. Discussed with the patient and all questioned fully answered. She will return if any problems arise.   Final diagnoses:  Knee pain, left       [facility], NP 03/20/15 1845  Benjiman Core, MD 03/20/15 2241

## 2015-03-20 NOTE — ED Notes (Signed)
Given ice pack and vicodin prepack to go.

## 2015-03-20 NOTE — ED Notes (Signed)
Pt reports left leg swelling and pain, denies injury or fall.  Pt reports "knot" above knee.

## 2015-03-20 NOTE — Discharge Instructions (Signed)
Ice the area, elevate, and wear the knee brace. Follow up with DR. Romeo AppleHarrison or Dr. Hilda LiasKeeling for further evaluation. Return here as needed.

## 2015-03-23 MED FILL — Hydrocodone-Acetaminophen Tab 5-325 MG: ORAL | Qty: 6 | Status: AC

## 2015-04-29 ENCOUNTER — Encounter: Payer: Self-pay | Admitting: Allergy and Immunology

## 2015-04-29 ENCOUNTER — Ambulatory Visit (INDEPENDENT_AMBULATORY_CARE_PROVIDER_SITE_OTHER): Payer: Managed Care, Other (non HMO) | Admitting: Allergy and Immunology

## 2015-04-29 VITALS — BP 120/76 | HR 68 | Temp 98.1°F | Resp 20 | Ht 61.42 in | Wt 206.8 lb

## 2015-04-29 DIAGNOSIS — J4541 Moderate persistent asthma with (acute) exacerbation: Secondary | ICD-10-CM | POA: Diagnosis not present

## 2015-04-29 DIAGNOSIS — T7800XA Anaphylactic reaction due to unspecified food, initial encounter: Secondary | ICD-10-CM | POA: Diagnosis not present

## 2015-04-29 DIAGNOSIS — J3089 Other allergic rhinitis: Secondary | ICD-10-CM | POA: Diagnosis not present

## 2015-04-29 DIAGNOSIS — J4540 Moderate persistent asthma, uncomplicated: Secondary | ICD-10-CM | POA: Insufficient documentation

## 2015-04-29 MED ORDER — BUDESONIDE-FORMOTEROL FUMARATE 160-4.5 MCG/ACT IN AERO: 2.0000 | INHALATION_SPRAY | Freq: Two times a day (BID) | RESPIRATORY_TRACT | Status: DC

## 2015-04-29 MED ORDER — PREDNISONE 1 MG PO TABS: 10.0000 mg | ORAL_TABLET | ORAL | Status: DC

## 2015-04-29 MED ORDER — FLUTICASONE PROPIONATE 50 MCG/ACT NA SUSP: 2.0000 | Freq: Every day | NASAL | Status: DC

## 2015-04-29 MED ORDER — ALBUTEROL SULFATE 108 (90 BASE) MCG/ACT IN AEPB: 2.0000 | INHALATION_SPRAY | RESPIRATORY_TRACT | Status: DC | PRN

## 2015-04-29 MED ORDER — MONTELUKAST SODIUM 10 MG PO TABS: 10.0000 mg | ORAL_TABLET | Freq: Every day | ORAL | Status: DC

## 2015-04-29 NOTE — Assessment & Plan Note (Signed)
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  A prescription has been provided for epinephrine auto-injector 2 pack along with instructions for proper administration.  A food allergy action plan has been provided and discussed.  Medic Alert identification is recommended.

## 2015-04-29 NOTE — Assessment & Plan Note (Addendum)
   Prednisone has been provided, 20 mg x 4 days, 10 mg x1 day, then stop.  A prescription has been provided for Symbicort (budesonide/formoterol) 160/4.5 g, 2 inhalations twice a day.  To maximize pulmonary deposition, a spacer has been provided along with instructions for its proper administration with an HFA inhaler.  A prescription has been provided for montelukast 10 mg daily at bedtime.  A prescription has been provided for ProAir Respiclick, 1-2 inhalations every 4-6 hours as needed.  Subjective and objective measures of pulmonary function will be followed and the treatment plan will be adjusted accordingly.

## 2015-04-29 NOTE — Assessment & Plan Note (Addendum)
   Aeroallergen avoidance measures have been discussed and provided in written form.  A prescription has been provided for fluticasone nasal spray, 2 sprays per nostril daily as needed. Proper nasal spray technique has been discussed and demonstrated.  A prescription has been provided for montelukast (as above).  Cetirizine 10 mg daily as needed.  The risks and benefits of aeroallergen immunotherapy have been discussed. The patient is motivated to initiate immunotherapy if insurance coverage is favorable. She will let us know how she would like to proceed.

## 2015-04-29 NOTE — Patient Instructions (Addendum)
Moderate persistent asthma with mild exacerbation  Prednisone has been provided, 20 mg x 4 days, 10 mg x1 day, then stop.  A prescription has been provided for Symbicort (budesonide/formoterol) 160/4.5 g, 2 inhalations twice a day.  To maximize pulmonary deposition, a spacer has been provided along with instructions for its proper administration with an HFA inhaler.  A prescription has been provided for montelukast 10 mg daily at bedtime.  A prescription has been provided for ProAir Respiclick, 1-2 inhalations every 4-6 hours as needed.  Subjective and objective measures of pulmonary function will be followed and the treatment plan will be adjusted accordingly.  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  A prescription has been provided for epinephrine auto-injector 2 pack along with instructions for proper administration.  A food allergy action plan has been provided and discussed.  Medic Alert identification is recommended.  Perennial and seasonal allergic rhinoconjunctivitis  Aeroallergen avoidance measures have been discussed and provided in written form.  A prescription has been provided for fluticasone nasal spray, 2 sprays per nostril daily as needed. Proper nasal spray technique has been discussed and demonstrated.  A prescription has been provided for montelukast (as above).  Cetirizine 10 mg daily as needed.  The risks and benefits of aeroallergen immunotherapy have been discussed. The patient is motivated to initiate immunotherapy if insurance coverage is favorable. She will let us know how she would like to proceed.    Return in about 6 weeks (around 06/10/2015), or if symptoms worsen or fail to improve.  Reducing Pollen Exposure  The American Academy of Allergy, Asthma and Immunology  suggests the following steps to reduce your exposure to pollen during allergy seasons.    1. Do not hang sheets or clothing out to dry; pollen may collect on these items. 2. Do not mow lawns or spend time around freshly cut grass; mowing stirs up pollen. 3. Keep windows closed at night.  Keep car windows closed while driving. 4. Minimize morning activities outdoors, a time when pollen counts are usually at their highest. 5. Stay indoors as much as possible when pollen counts or humidity is high and on windy days when pollen tends to remain in the air longer. 6. Use air conditioning when possible.  Many air conditioners have filters that trap the pollen spores. 7. Use a HEPA room air filter to remove pollen form the indoor air you breathe.   Control of House Dust Mite Allergen  House dust mites play a major role in allergic asthma and rhinitis.  They occur in environments with high humidity wherever human skin, the food for dust mites is found. High levels have been detected in dust obtained from mattresses, pillows, carpets, upholstered furniture, bed covers, clothes and soft toys.  The principal allergen of the house dust mite is found in its feces.  A gram of dust may contain 1,000 mites and 250,000 fecal particles.  Mite antigen is easily measured in the air during house cleaning activities.    1. Encase mattresses, including the box spring, and pillow, in an air tight cover.  Seal the zipper end of the encased mattresses with wide adhesive tape. 2. Wash the bedding in water of 130 degrees Farenheit weekly.  Avoid cotton comforters/quilts and flannel bedding: the most ideal bed covering is the dacron comforter. 3. Remove all upholstered furniture  from the bedroom. 4. Remove carpets, carpet padding, rugs, and non-washable window drapes from the bedroom.  Wash drapes weekly or use plastic window coverings. 5. Remove all non-washable stuffed toys from the bedroom.  Wash stuffed toys  weekly. 6. Have the room cleaned frequently with a vacuum cleaner and a damp dust-mop.  The patient should not be in a room which is being cleaned and should wait 1 hour after cleaning before going into the room. 7. Close and seal all heating outlets in the bedroom.  Otherwise, the room will become filled with dust-laden air.  An electric heater can be used to heat the room. Reduce indoor humidity to less than 50%.  Do not use a humidifier.  Control of Dog or Cat Allergen  Avoidance is the best way to manage a dog or cat allergy. If you have a dog or cat and are allergic to dog or cats, consider removing the dog or cat from the home. If you have a dog or cat but don't want to find it a new home, or if your family wants a pet even though someone in the household is allergic, here are some strategies that may help keep symptoms at bay:  1. Keep the pet out of your bedroom and restrict it to only a few rooms. Be advised that keeping the dog or cat in only one room will not limit the allergens to that room. 2. Don't pet, hug or kiss the dog or cat; if you do, wash your hands with soap and water. 3. High-efficiency particulate air (HEPA) cleaners run continuously in a bedroom or living room can reduce allergen levels over time. 4. Regular use of a high-efficiency vacuum cleaner or a central vacuum can reduce allergen levels. 5. Giving your dog or cat a bath at least once a week can reduce airborne allergen.  Control of Mold Allergen  Mold and fungi can grow on a variety of surfaces provided certain temperature and moisture conditions exist.  Outdoor molds grow on plants, decaying vegetation and soil.  The major outdoor mold, Alternaria and Cladosporium, are found in very high numbers during hot and dry conditions.  Generally, a late Summer - Fall peak is seen for common outdoor fungal spores.  Rain will temporarily lower outdoor mold spore count, but counts rise rapidly when the rainy period ends.  The  most important indoor molds are Aspergillus and Penicillium.  Dark, humid and poorly ventilated basements are ideal sites for mold growth.  The next most common sites of mold growth are the bathroom and the kitchen.  Outdoor [employer] 3. Use air conditioning and keep windows closed 4. Avoid exposure to decaying vegetation. 5. Avoid leaf raking. 6. Avoid grain handling. 7. Consider wearing a face mask if working in moldy areas.  Indoor Mold Control 1. Maintain humidity below 50%. 2. Clean washable surfaces with 5% bleach solution. 3. Remove sources e.g. Contaminated carpets.  Control of Cockroach Allergen  Cockroach allergen has been identified as an important cause of acute attacks of asthma, especially in urban settings.  There are fifty-five species of cockroach that exist in the [place], however only three, the [place], [place] species produce allergen that can affect patients with Asthma.  Allergens can be obtained from fecal particles, egg casings and secretions from cockroaches.    1. Remove food sources. 2. Reduce access to water. 3. Seal access and entry points. 4. Spray runways with 0.5-1% Diazinon or Chlorpyrifos 5. Blow boric acid  power under stoves and refrigerator. 6. Place bait stations (hydramethylnon) at feeding sites.

## 2015-04-29 NOTE — Progress Notes (Signed)
New Patient Note  RE: Kim White MRN: [id number] DOB: Jul 22, 1985 Date of Office Visit: 04/29/2015  Referring provider: No ref. provider found Primary care provider: Leo Grosser, MD  Chief Complaint: Asthma; Wheezing; Cough; and Food Intolerance   History of present illness: HPI Comments: Kim White is a 30 y.o. female who presents today for evaluation of asthma and rhinitis.  She was diagnosed with asthma when she was in the fourth grade and reports that her asthma has progressed in severity and frequency over this past year.  She currently experiences asthma symptoms on a daily basis and nocturnal awakenings due to lower respiratory symptoms one time per week on average.  She is required prednisone 2 times over this past year.  Specific triggers for her asthma include rapid weather changes, allergen exposure, particularly dogs and mold, strong perfumes, upper respiratory tract infections, and exercise.  She currently does not have asthma controller.  She also experiences nasal congestion, rhinorrhea, sneezing fits, postnasal drainage, irritated throat, and ocular pruritus.  The symptoms occur year round but are particularly triggered by exposure to pollens, dogs, and mold.  On multiple occasions she has experienced swelling of the eyelids, facial swelling, and the sensation of throat tightness with the consumption of shellfish.  She has even experienced these symptoms in the presence of cooked crab without consuming shellfish.  She reports that with the consumption of cashews she has experienced "raw, scratchy throat" followed by projectile vomiting.  She avoids shellfish and cashews but does not have an epinephrine autoinjector.   Assessment and plan: Moderate persistent asthma with mild exacerbation  Prednisone has been provided, 20 mg x 4 days, 10 mg x1 day, then stop.  A prescription has been provided for Symbicort (budesonide/formoterol) 160/4.5 g, 2 inhalations twice  a day.  To maximize pulmonary deposition, a spacer has been provided along with instructions for its proper administration with an HFA inhaler.  A prescription has been provided for montelukast 10 mg daily at bedtime.  A prescription has been provided for ProAir Respiclick, 1-2 inhalations every 4-6 hours as needed.  Subjective and objective measures of pulmonary function will be followed and the treatment plan will be adjusted accordingly.  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  A prescription has been provided for epinephrine auto-injector 2 pack along with instructions for proper administration.  A food allergy action plan has been provided and discussed.  Medic Alert identification is recommended.  Perennial and seasonal allergic rhinoconjunctivitis  Aeroallergen avoidance measures have been discussed and provided in written form.  A prescription has been provided for fluticasone nasal spray, 2 sprays per nostril daily as needed. Proper nasal spray technique has been discussed and demonstrated.  A prescription has been provided for montelukast (as above).  Cetirizine 10 mg daily as needed.  The risks and benefits of aeroallergen immunotherapy have been discussed. The patient is motivated to initiate immunotherapy if insurance coverage is favorable. She will let us know how she would like to proceed.    Meds ordered this encounter  Medications  . budesonide-formoterol (SYMBICORT) 160-4.5 MCG/ACT inhaler    Sig: Inhale 2 puffs into the lungs 2 (two) times daily.    Dispense:  1 Inhaler    Refill:  5  . montelukast (SINGULAIR) 10 MG tablet    Sig: Take 1 tablet (10 mg  total) by mouth at bedtime.    Dispense:  30 tablet    Refill:  3  . Albuterol Sulfate (PROAIR RESPICLICK) 108 (90 Base)  MCG/ACT AEPB    Sig: Inhale 2 puffs into the lungs as needed.    Dispense:  1 each    Refill:  1  . fluticasone (FLONASE) 50 MCG/ACT nasal spray    Sig: Place 2 sprays into both nostrils daily.    Dispense:  1 g    Refill:  5  . predniSONE (DELTASONE) tablet 10 mg    Sig:     Diagnositics: Spirometry: FVC was 2.48 L and FEV1 was 2.03 L (84% predicted) with significant (230 mL, 12%) post bronchodilator improvement. Environmental skin testing: Positive to grass pollen, weed pollen, ragweed pollen, tree pollen, mold, cat hair, dog epithelia, dust mite, cockroach antigen. Food allergen skin testing: Positive to shellfish mix, shrimp crab, oyster, lobster, scallop, cashew, walnut.    Physical examination: Blood pressure 120/76, pulse 68, temperature 98.1 F (36.7 C), temperature source Oral, resp. rate 20, height 5' 1.42" (1.56 m), weight 206 lb 12.7 oz (93.8 kg).  General: Alert, interactive, in no acute distress. HEENT: TMs pearly gray, turbinates edematous without discharge, post-pharynx erythematous. Neck: Supple without lymphadenopathy. Lungs: Mildly decreased breath sounds bilaterally without wheezing, rhonchi or rales. CV: Normal S1, S2 without murmurs. Abdomen: Nondistended, nontender. Skin: Warm and dry, without lesions or rashes. Extremities:  No clubbing, cyanosis or edema. Neuro:   Grossly intact.  Review of systems: Review of Systems  Constitutional: Negative for fever, chills and weight loss.  HENT: Positive for congestion. Negative for nosebleeds.   Eyes: Negative for blurred vision.  Respiratory: Positive for cough, shortness of breath and wheezing. Negative for hemoptysis.   Cardiovascular: Negative for chest pain.  Gastrointestinal: Negative for diarrhea and constipation.  Genitourinary: Negative for dysuria.  Musculoskeletal: Negative for myalgias and joint pain.  Neurological: Negative for dizziness.  Endo/Heme/Allergies: Positive for environmental allergies.  Does not bruise/bleed easily.    Past medical history: Past Medical History  Diagnosis Date  . Seasonal asthma     Past surgical history: Past Surgical History  Procedure Laterality Date  . Hernia repair    . Child birth  [birth year]2011    Family history: Family History  Problem Relation Age of Onset  . Cancer Other   . Asthma Brother   . Cancer Maternal Grandmother     Breast  . Allergic rhinitis Neg Hx   . Eczema Neg Hx   . Immunodeficiency Neg Hx   . Urticaria Neg Hx     Social history: Social History   Social History  . Marital Status: Married    Spouse Name: N/A  . Number of Children: N/A  . Years of Education: N/A   Occupational History  . Not on file.   Social History Main Topics  . Smoking status: Never Smoker   . Smokeless tobacco: Never Used  . Alcohol Use: No  . Drug Use: No  . Sexual Activity: Yes    Birth Control/ Protection: Implant   Other Topics Concern  . Not on file   Social History Narrative   Environmental History: The patient lives in an 28 year old house with hardwood floors throughout, kerosene heating, and window air conditioning units.  There are 2 dogs in house which have access to the bedroom.  She is a nonsmoker.    Medication List       This list is accurate as of: 04/29/15 12:45  PM.  Always use your most recent med list.               PROAIR HFA 108 (90 Base) MCG/ACT inhaler  Generic drug:  albuterol  Inhale 2 puffs into the lungs every 4 (four) hours as needed for wheezing or shortness of breath (cough).     albuterol 108 (90 Base) MCG/ACT inhaler  Commonly known as:  PROVENTIL HFA;VENTOLIN HFA  Inhale 2 puffs into the lungs every 6 (six) hours as needed for wheezing or shortness of breath.     Albuterol Sulfate 108 (90 Base) MCG/ACT Aepb  Commonly known as:  PROAIR RESPICLICK  Inhale 2 puffs into the lungs as needed.     budesonide-formoterol 160-4.5 MCG/ACT inhaler  Commonly known as:  SYMBICORT  Inhale 2  puffs into the lungs 2 (two) times daily.     fluticasone 50 MCG/ACT nasal spray  Commonly known as:  FLONASE  Place 2 sprays into both nostrils daily.     HYDROcodone-acetaminophen 5-325 MG tablet  Commonly known as:  NORCO/VICODIN  Take 2 tablets by mouth every 4 (four) hours as needed.     ibuprofen 600 MG tablet  Commonly known as:  ADVIL,MOTRIN  Take 1 tablet (600 mg total) by mouth every 6 (six) hours as needed.     levonorgestrel 20 MCG/24HR IUD  Commonly known as:  MIRENA  1 each by Intrauterine route once.     montelukast 10 MG tablet  Commonly known as:  SINGULAIR  Take 1 tablet (10 mg total) by mouth at bedtime.     predniSONE 50 MG tablet  Commonly known as:  DELTASONE  1 tablet PO daily        Known medication allergies: Allergies  Allergen Reactions  . Shellfish Allergy Swelling    I appreciate the opportunity to take part in this Alyssamarie's care. Please do not hesitate to contact me with questions.  Sincerely,   R. Jorene Guest, MD

## 2015-05-06 ENCOUNTER — Telehealth: Payer: Self-pay | Admitting: Allergy and Immunology

## 2015-05-06 NOTE — Telephone Encounter (Signed)
Pt called and said that her rx were not sent in and the singulair is making her sleepy. [phone] [facility] [place].

## 2015-05-06 NOTE — Addendum Note (Signed)
Addended by: Candis Schatz C on: 05/06/2015 07:44 PM   Modules accepted: Orders

## 2015-05-06 NOTE — Telephone Encounter (Signed)
Please advise 

## 2015-05-06 NOTE — Telephone Encounter (Signed)
Immunotherapy orders have been submitted. 

## 2015-05-07 DIAGNOSIS — J3089 Other allergic rhinitis: Secondary | ICD-10-CM | POA: Diagnosis not present

## 2015-05-08 DIAGNOSIS — J301 Allergic rhinitis due to pollen: Secondary | ICD-10-CM | POA: Diagnosis not present

## 2015-05-11 ENCOUNTER — Ambulatory Visit (INDEPENDENT_AMBULATORY_CARE_PROVIDER_SITE_OTHER): Payer: Managed Care, Other (non HMO) | Admitting: Allergy and Immunology

## 2015-05-11 ENCOUNTER — Encounter: Payer: Self-pay | Admitting: Allergy and Immunology

## 2015-05-11 VITALS — BP 116/80 | HR 68 | Temp 98.8°F | Resp 16

## 2015-05-11 DIAGNOSIS — J3089 Other allergic rhinitis: Secondary | ICD-10-CM | POA: Diagnosis not present

## 2015-05-11 DIAGNOSIS — T7800XD Anaphylactic reaction due to unspecified food, subsequent encounter: Secondary | ICD-10-CM

## 2015-05-11 DIAGNOSIS — J4540 Moderate persistent asthma, uncomplicated: Secondary | ICD-10-CM

## 2015-05-11 MED ORDER — EPINEPHRINE 0.3 MG/0.3ML IJ SOAJ: 0.3000 mg | Freq: Once | INTRAMUSCULAR | Status: DC

## 2015-05-11 MED ORDER — MOMETASONE FURO-FORMOTEROL FUM 200-5 MCG/ACT IN AERO: 2.0000 | INHALATION_SPRAY | Freq: Two times a day (BID) | RESPIRATORY_TRACT | Status: DC

## 2015-05-11 NOTE — Assessment & Plan Note (Signed)
   Continue meticulous avoidance of shellfish and tree nuts and have access to epinephrine autoinjector 2 pack in case of accidental ingestion.

## 2015-05-11 NOTE — Patient Instructions (Signed)
Moderate persistent asthma Suboptimal control with current regimen.  A prescription has been provided for [facility] (mometasone/formoterol) 200/5 g, 2 inhalations via spacer device twice a day.  Discontinue Symbicort.  Based upon perceived side effects with montelukast, this medication will be discontinued.  Subjective and objective measures of pulmonary function will be followed and the treatment plan will be adjusted accordingly.  Perennial and seasonal allergic rhinoconjunctivitis  The risks and benefits of aeroallergen immunotherapy have been discussed. The patient is motivated to initiate immunotherapy to reduce symptoms and decrease medication requirement. Informed consent has been signed and allergen vaccine orders have been submitted. Medications will be decreased or discontinued as symptom relief from immunotherapy becomes evident.   For now, continue appropriate allergen avoidance measures, fluticasone nasal spray as needed, and nasal saline irrigation.  Allergy with anaphylaxis due to food  Continue meticulous avoidance of shellfish and tree nuts and have access to epinephrine autoinjector 2 pack in case of accidental ingestion.   Return in about 4 months (around 09/08/2015), or if symptoms worsen or fail to improve.

## 2015-05-11 NOTE — Progress Notes (Signed)
Follow-up Note  RE: Kim White MRN: [id number] DOB: 1986/03/07 Date of Office Visit: 05/11/2015  Primary care provider: Leo Grosser, MD Referring provider: Donita Brooks, MD  History of present illness: HPI Comments: Kim White is a 30 y.o. female with persistent asthma and allergic rhinoconjunctivitis who presents today for follow up.  She reports that she discontinued montelukast due to the perceived side effect of profound somnolence.  She reports that her asthma symptoms have been better controlled after having started Symbicort 160/4.5 g, however she still requires albuterol rescue approximately 5 times per week.  She denies nocturnal awakenings due to lower respiratory symptoms.  She is interested in starting aeroallergen immunotherapy to reduce symptoms and decrease medication requirement.  She has no specific nasal symptom complaints today.   Assessment and plan: Moderate persistent asthma Suboptimal control with current regimen.  A prescription has been provided for [facility] (mometasone/formoterol) 200/5 g, 2 inhalations via spacer device twice a day.  Discontinue Symbicort.  Based upon perceived side effects with montelukast, this medication will be discontinued.  Subjective and objective measures of pulmonary function will be followed and the treatment plan will be adjusted accordingly.  Perennial and seasonal allergic rhinoconjunctivitis  The risks and benefits of aeroallergen immunotherapy have been discussed. The patient is motivated to initiate immunotherapy to reduce symptoms and decrease medication requirement. Informed consent has been signed and allergen vaccine orders have been submitted. Medications will be decreased or discontinued as symptom relief from immunotherapy becomes evident.   For now, continue appropriate allergen avoidance measures, fluticasone nasal spray as needed, and nasal saline irrigation.  Allergy with anaphylaxis due to  food  Continue meticulous avoidance of shellfish and tree nuts and have access to epinephrine autoinjector 2 pack in case of accidental ingestion.    Meds ordered this encounter  Medications  . EPINEPHrine 0.3 mg/0.3 mL IJ SOAJ injection    Sig: Inject 0.3 mLs (0.3 mg total) into the muscle once.    Dispense:  2 Device    Refill:  2    Dispense Mylan.  If generic is cheaper, dispense Mylan generic.  . mometasone-formoterol (DULERA) 200-5 MCG/ACT AERO    Sig: Inhale 2 puffs into the lungs 2 (two) times daily.    Dispense:  1 Inhaler    Refill:  2    Diagnositics: Spirometry:  Normal with an FEV1 of 94% predicted.  Please see scanned spirometry results for details.    Physical examination: Blood pressure 116/80, pulse 68, temperature 98.8 F (37.1 C), temperature source Oral, resp. rate 16.  General: Alert, interactive, in no acute distress. HEENT: TMs pearly gray, turbinates moderately edematous without discharge, post-pharynx mildly erythematous. Neck: Supple without lymphadenopathy. Lungs: Clear to auscultation without wheezing, rhonchi or rales. CV: Normal S1, S2 without murmurs. Skin: Warm and dry, without lesions or rashes.  The following portions of the patient's history were reviewed and updated as appropriate: allergies, current medications, past family history, past medical history, past social history, past surgical history and problem list.    Medication List       This list is accurate as of: 05/11/15  1:46 PM.  Always use your most recent med list.               PROAIR HFA 108 (90 Base) MCG/ACT inhaler  Generic drug:  albuterol  Inhale 2 puffs into the lungs every 4 (four) hours as needed for wheezing or shortness of breath (cough). Reported on 05/11/2015  albuterol 108 (90 Base) MCG/ACT inhaler  Commonly known as:  PROVENTIL HFA;VENTOLIN HFA  Inhale 2 puffs into the lungs every 6 (six) hours as needed for wheezing or shortness of breath.      Albuterol Sulfate 108 (90 Base) MCG/ACT Aepb  Commonly known as:  PROAIR RESPICLICK  Inhale 2 puffs into the lungs as needed.     budesonide-formoterol 160-4.5 MCG/ACT inhaler  Commonly known as:  SYMBICORT  Inhale 2 puffs into the lungs 2 (two) times daily.     EPIPEN 2-PAK 0.3 mg/0.3 mL Soaj injection  Generic drug:  EPINEPHrine  Inject 0.3 mg into the muscle once. Reported on 05/11/2015     EPINEPHrine 0.3 mg/0.3 mL Soaj injection  Commonly known as:  EPI-PEN  Inject 0.3 mLs (0.3 mg total) into the muscle once.     fluticasone 50 MCG/ACT nasal spray  Commonly known as:  FLONASE  Place 2 sprays into both nostrils daily.     HYDROcodone-acetaminophen 5-325 MG tablet  Commonly known as:  NORCO/VICODIN  Take 2 tablets by mouth every 4 (four) hours as needed.     ibuprofen 600 MG tablet  Commonly known as:  ADVIL,MOTRIN  Take 1 tablet (600 mg total) by mouth every 6 (six) hours as needed.     levonorgestrel 20 MCG/24HR IUD  Commonly known as:  MIRENA  1 each by Intrauterine route once.     mometasone-formoterol 200-5 MCG/ACT Aero  Commonly known as:  DULERA  Inhale 2 puffs into the lungs 2 (two) times daily.     montelukast 10 MG tablet  Commonly known as:  SINGULAIR  Take 1 tablet (10 mg total) by mouth at bedtime.     predniSONE 50 MG tablet  Commonly known as:  DELTASONE  1 tablet PO daily        Allergies  Allergen Reactions  . Other Nausea And Vomiting    ALL TREE NUTS  . Shellfish Allergy Swelling    I appreciate the opportunity to take part in this Leonna's care. Please do not hesitate to contact me with questions.  Sincerely,   R. Jorene Guest, MD

## 2015-05-11 NOTE — Assessment & Plan Note (Signed)
Suboptimal control with current regimen.  A prescription has been provided for [facility] (mometasone/formoterol) 200/5 g, 2 inhalations via spacer device twice a day.  Discontinue Symbicort.  Based upon perceived side effects with montelukast, this medication will be discontinued.  Subjective and objective measures of pulmonary function will be followed and the treatment plan will be adjusted accordingly.

## 2015-05-11 NOTE — Assessment & Plan Note (Signed)
   The risks and benefits of aeroallergen immunotherapy have been discussed. The patient is motivated to initiate immunotherapy to reduce symptoms and decrease medication requirement. Informed consent has been signed and allergen vaccine orders have been submitted. Medications will be decreased or discontinued as symptom relief from immunotherapy becomes evident.   For now, continue appropriate allergen avoidance measures, fluticasone nasal spray as needed, and nasal saline irrigation.

## 2015-05-14 ENCOUNTER — Encounter: Payer: Self-pay | Admitting: *Deleted

## 2015-05-21 ENCOUNTER — Ambulatory Visit: Payer: Managed Care, Other (non HMO) | Admitting: Certified Nurse Midwife

## 2015-05-26 ENCOUNTER — Encounter: Payer: Self-pay | Admitting: Family Medicine

## 2015-05-28 ENCOUNTER — Encounter: Payer: Self-pay | Admitting: Family Medicine

## 2015-05-28 ENCOUNTER — Ambulatory Visit (INDEPENDENT_AMBULATORY_CARE_PROVIDER_SITE_OTHER): Payer: Managed Care, Other (non HMO) | Admitting: Family Medicine

## 2015-05-28 VITALS — BP 104/72 | HR 74 | Temp 98.1°F | Resp 16 | Ht 61.5 in | Wt 211.0 lb

## 2015-05-28 DIAGNOSIS — Z0000 Encounter for general adult medical examination without abnormal findings: Secondary | ICD-10-CM

## 2015-05-28 NOTE — Progress Notes (Signed)
Subjective:    Patient ID: Kim White, female    DOB: 1985/08/18, 30 y.o.   MRN: [id number]  HPI Patient is here today to establish care. She is a very pleasant 30 year old African-American female. Past medical history is significant for moderate persistent asthma and seasonal allergies. At the present time her asthma is well controlled on dulera.  She is requiring her rescue inhaler a few of them 2 times per week. She is using Flonase for her seasonal allergies. Seasonal allergies tend to be her most severe trigger. She sees a gynecologist for her Pap smear and pelvic exam. Otherwise she is healthy. She has not had her flu shot but she politely declines that today Past Medical History  Diagnosis Date  . Seasonal asthma   . Allergy    Past Surgical History  Procedure Laterality Date  . Hernia repair    . Child birth  05/05/2011   Current Outpatient Prescriptions on File Prior to Visit  Medication Sig Dispense Refill  . albuterol (PROAIR HFA) 108 (90 Base) MCG/ACT inhaler Inhale 2 puffs into the lungs every 4 (four) hours as needed for wheezing or shortness of breath (cough). Reported on 05/11/2015    . EPINEPHrine (EPIPEN 2-PAK) 0.3 mg/0.3 mL IJ SOAJ injection Inject 0.3 mg into the muscle once. Reported on 05/11/2015    . fluticasone (FLONASE) 50 MCG/ACT nasal spray Place 2 sprays into both nostrils daily. 1 g 5  . levonorgestrel (MIRENA) 20 MCG/24HR IUD 1 each by Intrauterine route once.    . mometasone-formoterol (DULERA) 200-5 MCG/ACT AERO Inhale 2 puffs into the lungs 2 (two) times daily. 1 Inhaler 2   No current facility-administered medications on file prior to visit.   Allergies  Allergen Reactions  . Other Nausea And Vomiting    ALL TREE NUTS  . Shellfish Allergy Swelling   Social History   Social History  . Marital Status: Married    Spouse Name: N/A  . Number of Children: N/A  . Years of Education: N/A   Occupational History  . Not on file.   Social History  Main Topics  . Smoking status: Never Smoker   . Smokeless tobacco: Never Used  . Alcohol Use: No  . Drug Use: No  . Sexual Activity: Yes    Birth Control/ Protection: IUD     Comment: married, 2 boys, works at [employer]   Other Topics Concern  . Not on file   Social History Narrative   Family History  Problem Relation Age of Onset  . Cancer Other   . Asthma Brother   . Hyperlipidemia Maternal Grandmother   . Cancer Maternal Grandmother     Breast  . Allergic rhinitis Neg Hx   . Eczema Neg Hx   . Immunodeficiency Neg Hx   . Urticaria Neg Hx   . Arthritis Mother   . Hearing loss Mother   . Miscarriages / [place] Mother   . Hyperlipidemia Maternal Grandfather   . Heart disease Maternal Grandfather     in his 63's  . Alcohol abuse Paternal Grandmother   . Stroke Father     suspected from substance  . Hypertension Father       Review of Systems     Objective:   Physical Exam  Constitutional: She is oriented to person, place, and time. She appears well-developed and well-nourished. No distress.  HENT:  Head: Normocephalic and atraumatic.  Right Ear: External ear normal.  Left Ear: External ear normal.  Nose: Nose normal.  Mouth/Throat: Oropharynx is clear and moist. No oropharyngeal exudate.  Eyes: Conjunctivae and EOM are normal. Pupils are equal, round, and reactive to light. Right eye exhibits no discharge. Left eye exhibits no discharge. No scleral icterus.  Neck: Normal range of motion. Neck supple. No JVD present. No tracheal deviation present. No thyromegaly present.  Cardiovascular: Normal rate, regular rhythm, normal heart sounds and intact distal pulses.  Exam reveals no gallop and no friction rub.   No murmur heard. Pulmonary/Chest: Effort normal and breath sounds normal. No respiratory distress. She has no wheezes. She has no rales. She exhibits no tenderness.  Abdominal: Soft. Bowel sounds are normal. She exhibits no distension and no mass. There  is no tenderness. There is no rebound and no guarding.  Musculoskeletal: Normal range of motion. She exhibits no edema or tenderness.  Lymphadenopathy:    She has no cervical adenopathy.  Neurological: She is alert and oriented to person, place, and time. She has normal reflexes. She displays normal reflexes. No cranial nerve deficit. She exhibits normal muscle tone. Coordination normal.  Skin: Skin is warm. No rash noted. She is not diaphoretic. No erythema. No pallor.  Psychiatric: She has a normal mood and affect. Her behavior is normal. Judgment and thought content normal.  Vitals reviewed.         Assessment & Plan:  Routine general medical examination at a health care facility - Plan: CBC with Differential/Platelet, COMPLETE METABOLIC PANEL WITH GFR, Lipid panel  Physical exam is normal. I did recommend she start Claritin 10 mg by mouth daily over-the-counter during allergy season to help prevent asthma exacerbations. I strongly recommended a flu shot. Also recommended diet exercise and weight loss. I'll check a CBC, CMP, and fasting lipid panel. Her Pap smears performed at her gynecologist.

## 2015-05-29 ENCOUNTER — Encounter: Payer: Self-pay | Admitting: Family Medicine

## 2015-05-29 LAB — COMPLETE METABOLIC PANEL WITH GFR
ALT: 18 U/L (ref 6–29)
AST: 17 U/L (ref 10–30)
Albumin: 3.6 g/dL (ref 3.6–5.1)
Alkaline Phosphatase: 44 U/L (ref 33–115)
BUN: 11 mg/dL (ref 7–25)
CHLORIDE: 104 mmol/L (ref 98–110)
CO2: 24 mmol/L (ref 20–31)
CREATININE: 0.72 mg/dL (ref 0.50–1.10)
Calcium: 8.9 mg/dL (ref 8.6–10.2)
GFR, Est Non African American: >89 mL/min (ref >=60)
Glucose, Bld: 88 mg/dL (ref 70–99)
Potassium: 4.1 mmol/L (ref 3.5–5.3)
SODIUM: 138 mmol/L (ref 135–146)
Total Bilirubin: 0.5 mg/dL (ref 0.2–1.2)
Total Protein: 7 g/dL (ref 6.1–8.1)

## 2015-05-29 LAB — CBC WITH DIFFERENTIAL/PLATELET
BASOS PCT: 0 % (ref 0–1)
Basophils Absolute: 0 10*3/uL (ref 0.0–0.1)
EOS ABS: 0.2 10*3/uL (ref 0.0–0.7)
EOS PCT: 4 % (ref 0–5)
HCT: 41.7 % (ref 36.0–46.0)
Hemoglobin: 13.6 g/dL (ref 12.0–15.0)
Lymphocytes Relative: 41 % (ref 12–46)
Lymphs Abs: 2 10*3/uL (ref 0.7–4.0)
MCH: 27.1 pg (ref 26.0–34.0)
MCHC: 32.6 g/dL (ref 30.0–36.0)
MCV: 83.1 fL (ref 78.0–100.0)
MPV: 11 fL (ref 8.6–12.4)
Monocytes Absolute: 0.4 10*3/uL (ref 0.1–1.0)
Monocytes Relative: 9 % (ref 3–12)
Neutro Abs: 2.2 10*3/uL (ref 1.7–7.7)
Neutrophils Relative %: 46 % (ref 43–77)
PLATELETS: 237 10*3/uL (ref 150–400)
RBC: 5.02 MIL/uL (ref 3.87–5.11)
RDW: 14.2 % (ref 11.5–15.5)
WBC: 4.8 10*3/uL (ref 4.0–10.5)

## 2015-05-29 LAB — LIPID PANEL
CHOL/HDL RATIO: 3.1 ratio (ref <=5.0)
Cholesterol: 148 mg/dL (ref 125–200)
HDL: 48 mg/dL (ref >=46)
LDL Cholesterol: 87 mg/dL (ref <130)
Triglycerides: 65 mg/dL (ref <150)
VLDL: 13 mg/dL (ref <30)

## 2015-06-10 ENCOUNTER — Ambulatory Visit: Payer: Managed Care, Other (non HMO) | Admitting: Allergy and Immunology

## 2015-06-18 ENCOUNTER — Encounter: Payer: Self-pay | Admitting: Certified Nurse Midwife

## 2015-06-18 ENCOUNTER — Ambulatory Visit (INDEPENDENT_AMBULATORY_CARE_PROVIDER_SITE_OTHER): Payer: Managed Care, Other (non HMO) | Admitting: Certified Nurse Midwife

## 2015-06-18 VITALS — BP 128/73 | HR 70 | Temp 98.7°F | Wt 207.0 lb

## 2015-06-18 DIAGNOSIS — Z1389 Encounter for screening for other disorder: Secondary | ICD-10-CM

## 2015-06-18 DIAGNOSIS — Z01419 Encounter for gynecological examination (general) (routine) without abnormal findings: Secondary | ICD-10-CM | POA: Diagnosis not present

## 2015-06-18 DIAGNOSIS — Z0000 Encounter for general adult medical examination without abnormal findings: Secondary | ICD-10-CM

## 2015-06-18 LAB — POCT URINALYSIS DIPSTICK
Bilirubin, UA: NEGATIVE
Glucose, UA: NEGATIVE
Ketones, UA: NEGATIVE
Nitrite, UA: NEGATIVE
Spec Grav, UA: >=1.03
Urobilinogen, UA: NEGATIVE
pH, UA: 5
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
## 2015-06-19 LAB — COMPREHENSIVE METABOLIC PANEL
ALBUMIN: 4.2 g/dL (ref 3.5–5.5)
ALT: 16 IU/L (ref 0–32)
AST: 21 IU/L (ref 0–40)
Albumin/Globulin Ratio: 1.2 (ref 1.2–2.2)
Alkaline Phosphatase: 50 IU/L (ref 39–117)
BUN / CREAT RATIO: 19 (ref 9–23)
BUN: 17 mg/dL (ref 6–20)
Bilirubin Total: 0.4 mg/dL (ref 0.0–1.2)
CALCIUM: 9.6 mg/dL (ref 8.7–10.2)
CHLORIDE: 99 mmol/L (ref 96–106)
CO2: 23 mmol/L (ref 18–29)
Creatinine, Ser: 0.88 mg/dL (ref 0.57–1.00)
GFR, EST AFRICAN AMERICAN: 104 mL/min/{1.73_m2} (ref >59)
GFR, EST NON AFRICAN AMERICAN: 90 mL/min/{1.73_m2} (ref >59)
GLUCOSE: 57 mg/dL — AB (ref 65–99)
Globulin, Total: 3.5 g/dL (ref 1.5–4.5)
Potassium: 4 mmol/L (ref 3.5–5.2)
Sodium: 140 mmol/L (ref 134–144)
Total Protein: 7.7 g/dL (ref 6.0–8.5)

## 2015-06-19 LAB — HEMOGLOBIN A1C
Est. average glucose Bld gHb Est-mCnc: 100 mg/dL
HEMOGLOBIN A1C: 5.1 % (ref 4.8–5.6)

## 2015-06-19 LAB — CBC WITH DIFFERENTIAL/PLATELET
BASOS ABS: 0 10*3/uL (ref 0.0–0.2)
BASOS: 0 %
EOS (ABSOLUTE): 0.2 10*3/uL (ref 0.0–0.4)
Eos: 4 %
HEMOGLOBIN: 14.3 g/dL (ref 11.1–15.9)
Hematocrit: 42.8 % (ref 34.0–46.6)
IMMATURE GRANULOCYTES: 0 %
Immature Grans (Abs): 0 10*3/uL (ref 0.0–0.1)
Lymphocytes Absolute: 2.5 10*3/uL (ref 0.7–3.1)
Lymphs: 48 %
MCH: 27.9 pg (ref 26.6–33.0)
MCHC: 33.4 g/dL (ref 31.5–35.7)
MCV: 83 fL (ref 79–97)
MONOCYTES: 9 %
Monocytes Absolute: 0.5 10*3/uL (ref 0.1–0.9)
NEUTROS ABS: 2 10*3/uL (ref 1.4–7.0)
NEUTROS PCT: 39 %
PLATELETS: 209 10*3/uL (ref 150–379)
RBC: 5.13 x10E6/uL (ref 3.77–5.28)
RDW: 13.3 % (ref 12.3–15.4)
WBC: 5.2 10*3/uL (ref 3.4–10.8)

## 2015-06-19 LAB — TSH: TSH: 1.91 u[IU]/mL (ref 0.450–4.500)

## 2015-06-22 ENCOUNTER — Other Ambulatory Visit: Payer: Self-pay | Admitting: Certified Nurse Midwife

## 2015-06-22 DIAGNOSIS — N760 Acute vaginitis: Secondary | ICD-10-CM

## 2015-06-22 LAB — PAP IG W/ RFLX HPV ASCU: PAP Smear Comment: 0

## 2015-06-22 LAB — NUSWAB VAGINITIS (VG)
ATOPOBIUM VAGINAE: HIGH {score} — AB
Candida albicans, NAA: POSITIVE — AB
Candida glabrata, NAA: NEGATIVE
TRICH VAG BY NAA: NEGATIVE

## 2015-06-22 MED ORDER — FLUCONAZOLE 100 MG PO TABS: 100.0000 mg | ORAL_TABLET | Freq: Once | ORAL | Status: DC

## 2015-06-22 MED ORDER — TERCONAZOLE 0.4 % VA CREA: 1.0000 | TOPICAL_CREAM | Freq: Every day | VAGINAL | Status: DC

## 2015-06-23 ENCOUNTER — Encounter: Payer: Self-pay | Admitting: *Deleted

## 2015-06-28 ENCOUNTER — Encounter (HOSPITAL_COMMUNITY): Payer: Self-pay | Admitting: Emergency Medicine

## 2015-06-28 ENCOUNTER — Emergency Department (HOSPITAL_COMMUNITY)
Admission: EM | Admit: 2015-06-28 | Discharge: 2015-06-28 | Disposition: A | Discharge status: Home / Self Care | Payer: Managed Care, Other (non HMO) | Attending: Emergency Medicine | Admitting: Emergency Medicine

## 2015-06-28 DIAGNOSIS — T2006XA Burn of unspecified degree of forehead and cheek, initial encounter: Secondary | ICD-10-CM | POA: Insufficient documentation

## 2015-06-28 DIAGNOSIS — X088XXA Exposure to other specified smoke, fire and flames, initial encounter: Secondary | ICD-10-CM | POA: Insufficient documentation

## 2015-06-28 DIAGNOSIS — Z79899 Other long term (current) drug therapy: Secondary | ICD-10-CM | POA: Insufficient documentation

## 2015-06-28 DIAGNOSIS — T2200XA Burn of unspecified degree of shoulder and upper limb, except wrist and hand, unspecified site, initial encounter: Secondary | ICD-10-CM | POA: Diagnosis not present

## 2015-06-28 DIAGNOSIS — Y929 Unspecified place or not applicable: Secondary | ICD-10-CM | POA: Insufficient documentation

## 2015-06-28 DIAGNOSIS — Y999 Unspecified external cause status: Secondary | ICD-10-CM | POA: Insufficient documentation

## 2015-06-28 DIAGNOSIS — T300 Burn of unspecified body region, unspecified degree: Secondary | ICD-10-CM

## 2015-06-28 DIAGNOSIS — Y93G2 Activity, grilling and smoking food: Secondary | ICD-10-CM | POA: Diagnosis not present

## 2015-06-28 MED ORDER — KETOROLAC TROMETHAMINE 30 MG/ML IJ SOLN
30.0000 mg | Freq: Once | INTRAMUSCULAR | Status: AC
  Administered 2015-06-28: 30 mg via INTRAVENOUS
  Filled 2015-06-28: qty 1

## 2015-06-28 MED ORDER — SODIUM CHLORIDE 0.9 % IV BOLUS (SEPSIS)
1000.0000 mL | Freq: Once | INTRAVENOUS | Status: AC
  Administered 2015-06-28: 1000 mL via INTRAVENOUS
  Filled 2015-06-28: qty 1000

## 2015-06-28 MED ORDER — HYDROMORPHONE HCL 1 MG/ML IJ SOLN
1.0000 mg | Freq: Once | INTRAMUSCULAR | Status: AC
  Administered 2015-06-28: 1 mg via INTRAVENOUS
  Filled 2015-06-28: qty 1

## 2015-06-28 MED ORDER — HYDROCODONE-ACETAMINOPHEN 5-325 MG PO TABS: 1.0000 | ORAL_TABLET | Freq: Four times a day (QID) | ORAL | Status: DC | PRN

## 2015-06-28 MED ORDER — HYDROCODONE-ACETAMINOPHEN 5-325 MG PO TABS: 1.0000 | ORAL_TABLET | ORAL | Status: DC | PRN

## 2015-06-28 MED ORDER — SILVER SULFADIAZINE 1 % EX CREA
TOPICAL_CREAM | CUTANEOUS | Status: AC
  Filled 2015-06-28: qty 50
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
## 2015-06-30 MED FILL — Hydrocodone-Acetaminophen Tab 5-325 MG: ORAL | Qty: 6 | Status: AC

## 2015-08-28 ENCOUNTER — Encounter: Payer: Self-pay | Admitting: Family Medicine

## 2015-08-28 ENCOUNTER — Ambulatory Visit (INDEPENDENT_AMBULATORY_CARE_PROVIDER_SITE_OTHER): Payer: Managed Care, Other (non HMO) | Admitting: Family Medicine

## 2015-08-28 VITALS — BP 132/90 | HR 80 | Temp 98.4°F | Resp 14 | Ht 62.0 in | Wt 208.0 lb

## 2015-08-28 DIAGNOSIS — G4761 Periodic limb movement disorder: Secondary | ICD-10-CM | POA: Diagnosis not present

## 2015-08-28 NOTE — Progress Notes (Signed)
Subjective:    Patient ID: Kim White, female    DOB: 03/29/1985, 30 y.o.   MRN: [id number]  HPI Patient has been married for 6 years. She states that her husband reports that she has unusual jerking movements of her limbs and even her head and neck when she sleeps. These occur almost on a nightly basis. She is concerned because she has a family history of epilepsy. She has no history of seizures. The symptoms never occur during the daytime. They only occur at night. She always has to be asleep for them to occur. She does not notice them. She denies any tongue biting, bowel or bladder incontinence, or muscle pains. Her husband denies any history of witnessed apnea episodes. She denies any hypersomnolence or excessive fatigue. She denies any symptoms of restless leg syndrome during the day. Past Medical History  Diagnosis Date  . Seasonal asthma   . Allergy    Past Surgical History  Procedure Laterality Date  . Hernia repair    . Child birth  05/05/2011   Current Outpatient Prescriptions on File Prior to Visit  Medication Sig Dispense Refill  . albuterol (PROAIR HFA) 108 (90 Base) MCG/ACT inhaler Inhale 2 puffs into the lungs every 4 (four) hours as needed for wheezing or shortness of breath (cough). Reported on 05/11/2015    . EPINEPHrine (EPIPEN 2-PAK) 0.3 mg/0.3 mL IJ SOAJ injection Inject 0.3 mg into the muscle once. Reported on 05/11/2015    . levonorgestrel (MIRENA) 20 MCG/24HR IUD 1 each by Intrauterine route once.    . mometasone-formoterol (DULERA) 200-5 MCG/ACT AERO Inhale 2 puffs into the lungs 2 (two) times daily. 1 Inhaler 2   No current facility-administered medications on file prior to visit.   Allergies  Allergen Reactions  . Other Nausea And Vomiting    ALL TREE NUTS  . Shellfish Allergy Swelling   Social History   Social History  . Marital Status: Married    Spouse Name: N/A  . Number of Children: N/A  . Years of Education: N/A   Occupational History  .  Not on file.   Social History Main Topics  . Smoking status: Never Smoker   . Smokeless tobacco: Never Used  . Alcohol Use: No  . Drug Use: No  . Sexual Activity: Yes    Birth Control/ Protection: IUD     Comment: married, 2 boys, works at [employer]   Other Topics Concern  . Not on file   Social History Narrative      Review of Systems  All other systems reviewed and are negative.      Objective:   Physical Exam  Constitutional: She is oriented to person, place, and time.  Cardiovascular: Normal rate, regular rhythm and normal heart sounds.   No murmur heard. Pulmonary/Chest: Effort normal and breath sounds normal. No respiratory distress. She has no wheezes. She has no rales.  Neurological: She is alert and oriented to person, place, and time. She has normal reflexes. She displays normal reflexes. No cranial nerve deficit. She exhibits normal muscle tone. Coordination normal.  Vitals reviewed.         Assessment & Plan:  Periodic limb movement sleep disorder  Symptoms sound consistent with periodic limb movements of sleep. I offered the patient a trial of Mirapex versus Requip or proceeding with a sleep study to characterize further and rule out nocturnal seizures. However I believe her history is inconsistent to be nocturnal seizures given the high frequency and  no spillover into daytime. Patient will discuss with her husband and decide whether she would like to try the medication or proceed with a sleep study

## 2015-08-31 ENCOUNTER — Telehealth: Payer: Self-pay | Admitting: Family Medicine

## 2015-08-31 DIAGNOSIS — G4761 Periodic limb movement disorder: Secondary | ICD-10-CM

## 2015-08-31 NOTE — Telephone Encounter (Signed)
Patient calling to say that dr pickard had discussed a referral for sleep study she decided this is what she would like to do, can we place referral [phone]

## 2015-09-02 NOTE — Telephone Encounter (Signed)
Order placed

## 2015-09-21 ENCOUNTER — Institutional Professional Consult (permissible substitution): Payer: Managed Care, Other (non HMO) | Admitting: Neurology

## 2015-09-21 ENCOUNTER — Telehealth: Payer: Self-pay

## 2015-09-21 NOTE — Telephone Encounter (Signed)
Patient called in saying that she would be late to appt. Patient rescheduled for Wednesday

## 2015-09-22 ENCOUNTER — Encounter: Payer: Self-pay | Admitting: Neurology

## 2015-09-23 ENCOUNTER — Ambulatory Visit (INDEPENDENT_AMBULATORY_CARE_PROVIDER_SITE_OTHER): Payer: Managed Care, Other (non HMO) | Admitting: Neurology

## 2015-09-23 ENCOUNTER — Encounter: Payer: Self-pay | Admitting: Neurology

## 2015-09-23 VITALS — BP 112/86 | HR 60 | Resp 16 | Ht 62.0 in | Wt 209.0 lb

## 2015-09-23 DIAGNOSIS — G4726 Circadian rhythm sleep disorder, shift work type: Secondary | ICD-10-CM | POA: Diagnosis not present

## 2015-09-23 DIAGNOSIS — G4710 Hypersomnia, unspecified: Secondary | ICD-10-CM

## 2015-09-23 DIAGNOSIS — E669 Obesity, unspecified: Secondary | ICD-10-CM

## 2015-09-23 DIAGNOSIS — G4761 Periodic limb movement disorder: Secondary | ICD-10-CM | POA: Diagnosis not present

## 2015-09-23 DIAGNOSIS — R258 Other abnormal involuntary movements: Secondary | ICD-10-CM | POA: Diagnosis not present

## 2015-09-23 DIAGNOSIS — R0683 Snoring: Secondary | ICD-10-CM

## 2015-09-23 DIAGNOSIS — R0681 Apnea, not elsewhere classified: Secondary | ICD-10-CM

## 2015-09-23 DIAGNOSIS — R252 Cramp and spasm: Secondary | ICD-10-CM

## 2015-09-23 NOTE — Patient Instructions (Signed)
Based on your symptoms and your exam I believe you are at risk for obstructive sleep apnea or OSA, and I think we should proceed with a sleep study to determine whether you do or do not have OSA and how severe it is. If you have more than mild OSA, I want you to consider treatment with CPAP. Please remember, the risks and ramifications of moderate to severe obstructive sleep apnea or OSA are: Cardiovascular disease, including congestive heart failure, stroke, difficult to control hypertension, arrhythmias, and even type 2 diabetes has been linked to untreated OSA. Sleep apnea causes disruption of sleep and sleep deprivation in most cases, which, in turn, can cause recurrent headaches, problems with memory, mood, concentration, focus, and vigilance. Most people with untreated sleep apnea report excessive daytime sleepiness, which can affect their ability to drive. Please do not drive if you feel sleepy.   We will look for involuntary movements during the sleep study.   I will likely see you back after your sleep study to go over the test results and where to go from there. We will call you after your sleep study to advise about the results (most likely, you will hear from Lafonda Mossesiana, my nurse) and to set up an appointment at the time, as necessary.    Our sleep lab administrative assistant, Alvis LemmingsDawn will meet with you or call you to schedule your sleep study. If you don't hear back from her by next week please feel free to call her at [phone]. This is her direct line and please leave a message with your phone number to call back if you get the voicemail box. She will call back as soon as possible.

## 2015-09-23 NOTE — Progress Notes (Signed)
Subjective:    Patient ID: Kim White is a 30 y.o. female.  HPI     Huston Foley, MD, PhD [facility] [address] [address] [place], [place] [address]  Dear Dr. Tanya Nones,   I saw your patient, Kim White, upon your kind request in my neurologic clinic today for initial consultation of her sleep disorder, in particular concern for PLMD and abnormal leg twitching and movements while asleep. The patient is accompanied by her husband and 2 children today. Of note, she no showed for an appointment on 09/21/2015. As you know, Ms. Arlen is a very pleasant 30 year old right-handed woman with an underlying medical history of allergies and seasonal asthma, as well as obesity, who reports involuntary limb movements while asleep. She is reported to twitch in her sleep by her husband. He describes jerking of her body or flailing movements of her arms and legs, sometimes associated with a gasping sound or pauses in her breathing while asleep. She does not endorse any gasping sensation while asleep. She does snore loudly per husband. She denies any nocturia. She denies restless leg symptoms. She is not aware of any family history of OSA or restless leg syndrome. She denies any involuntary movements during wakefulness or prior to falling asleep. She has been working third shift for the past 6 months. 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 She has a three-hour commute to work. She works from 11:30 PM to 8 AM. She gets home around 11 AM and goes to bed usually around noon. During her nights off she sleeps at night. She does not report sleepiness at the wheel. She has been married for 6 years. Her husband reports nearly nightly twitching. She has a family history of epilepsy (uncle and first cousin), but no personal history of seizures or epilepsy. Her mother has basilar migraines, she herself does not suffer from migraine headaches. She has frequent morning  headaches though, these are transient and dull and achy, global. She does not take medication for these. I reviewed your office note from 08/28/2015. She lives at home with her husband and 2 boys. She does not smoke or drink alcohol or drink caffeine, she does not use illicit drugs. She denies sleep attacks but is sleepy when sedentary. Denies episodes of weakness or cataplexy and denies episodes of hallucinations or dream sequences.  Her Past Medical History Is Significant For: Past Medical History  Diagnosis Date  . Seasonal asthma   . Allergy     Her Past Surgical History Is Significant For: Past Surgical History  Procedure Laterality Date  . Hernia repair    . Child birth  [birth year]/05/2011    Her Family History Is Significant For: Family History  Problem Relation Age of Onset  . Cancer Other   . Asthma Brother   . Hyperlipidemia Maternal Grandmother   . Cancer Maternal Grandmother     Breast  . Allergic rhinitis Neg Hx   . Eczema Neg Hx   . Immunodeficiency Neg Hx   . Urticaria Neg Hx   . Arthritis Mother   . Hearing loss Mother   . Miscarriages / [place] Mother   . Hyperlipidemia Maternal Grandfather   . Heart disease Maternal Grandfather     in his 2's  . Alcohol abuse Paternal Grandmother   . Stroke Father     suspected from substance  . Hypertension Father     Her Social History Is Significant For: Social History  Social History  . Marital Status: Married    Spouse Name: N/A  . Number of Children: 2  . Years of Education: HS   Social History Main Topics  . Smoking status: Never Smoker   . Smokeless tobacco: Never Used  . Alcohol Use: No  . Drug Use: No  . Sexual Activity: Yes    Birth Control/ Protection: IUD     Comment: married, 2 boys, works at [employer]   Other Topics Concern  . None   Social History Narrative   Denies caffeine use     Her Allergies Are:  Allergies  Allergen Reactions  . Other Nausea And Vomiting    ALL TREE NUTS  .  Shellfish Allergy Swelling  :   Her Current Medications Are:  Outpatient Encounter Prescriptions as of 09/23/2015  Medication Sig  . albuterol (PROAIR HFA) 108 (90 Base) MCG/ACT inhaler Inhale 2 puffs into the lungs every 4 (four) hours as needed for wheezing or shortness of breath (cough). Reported on 05/11/2015  . EPINEPHrine (EPIPEN 2-PAK) 0.3 mg/0.3 mL IJ SOAJ injection Inject 0.3 mg into the muscle once. Reported on 05/11/2015  . levonorgestrel (MIRENA) 20 MCG/24HR IUD 1 each by Intrauterine route once.  . mometasone-formoterol (DULERA) 200-5 MCG/ACT AERO Inhale 2 puffs into the lungs 2 (two) times daily.   No facility-administered encounter medications on file as of 09/23/2015.  :  Review of Systems:  Out of a complete 14 point review of systems, all are reviewed and negative with the exception of these symptoms as listed below:   Review of Systems  Neurological:       No trouble falling or staying asleep, snoring, witnessed apnea, wakes up feeling tired, morning headaches, daytime tiredness, takes naps during day.    Epworth Sleepiness Scale 0= would never doze 1= slight chance of dozing 2= moderate chance of dozing 3= high chance of dozing  Sitting and reading:2 Watching TV:3 Sitting inactive in a public place (ex. Theater or meeting):2 As a passenger in a car for an hour without a break:3 Lying down to rest in the afternoon:3 Sitting and talking to someone:2 Sitting quietly after lunch (no alcohol):3 In a car, while stopped in traffic:2 Total:20  Objective:  Neurologic Exam  Physical Exam Physical Examination:   Filed Vitals:   09/23/15 1616  BP: 112/86  Pulse: 60  Resp: 16   General Examination: The patient is a very pleasant 30 y.o. female in no acute distress. She appears well-developed and well-nourished and well groomed.   HEENT: Normocephalic, atraumatic, pupils are equal, round and reactive to light and accommodation. Funduscopic exam is normal with  sharp disc margins noted. Extraocular tracking is good without limitation to gaze excursion or nystagmus noted. Normal smooth pursuit is noted. Hearing is grossly intact. Tympanic membranes are clear bilaterally. Face is symmetric with normal facial animation and normal facial sensation. Speech is clear with no dysarthria noted. There is no hypophonia. There is no lip, neck/head, jaw or voice tremor. Neck is supple with full range of passive and active motion. There are no carotid bruits on auscultation. Oropharynx exam reveals: mild mouth dryness, good dental hygiene and mild airway crowding, due to wider tongue, redundant soft palate, tonsils in place, about 1-2+ bilaterally. Mallampati is class I. Neck circumference is 15-3/4 inches. She has a mild overbite.  Chest: Clear to auscultation without wheezing, rhonchi or crackles noted.  Heart: S1+S2+0, regular and normal without murmurs, rubs or gallops noted.   Abdomen: Soft,  non-tender and non-distended with normal bowel sounds appreciated on auscultation.  Extremities: There is no pitting edema in the distal lower extremities bilaterally. Pedal pulses are intact.  Skin: Warm and dry without trophic changes noted. There are no varicose veins.  Musculoskeletal: exam reveals no obvious joint deformities, tenderness or joint swelling or erythema.   Neurologically:  Mental status: The patient is awake, alert and oriented in all 4 spheres. Her immediate and remote memory, attention, language skills and fund of knowledge are appropriate. There is no evidence of aphasia, agnosia, apraxia or anomia. Speech is clear with normal prosody and enunciation. Thought process is linear. Mood is normal and affect is normal.  Cranial nerves II - XII are as described above under HEENT exam. In addition: shoulder shrug is normal with equal shoulder height noted. Motor exam: Normal bulk, strength and tone is noted. There is no drift, tremor or rebound. Romberg is  negative. Reflexes are 2+ throughout. Babinski: Toes are flexor bilaterally. Fine motor skills and coordination: intact with normal finger taps, normal hand movements, normal rapid alternating patting, normal foot taps and normal foot agility.  Cerebellar testing: No dysmetria or intention tremor on finger to nose testing. Heel to shin is unremarkable bilaterally. There is no truncal or gait ataxia.  Sensory exam: intact to light touch, pinprick, vibration, temperature sense in the upper and lower extremities.  Gait, station and balance: She stands easily. No veering to one side is noted. No leaning to one side is noted. Posture is age-appropriate and stance is narrow based. Gait shows normal stride length and normal pace. No problems turning are noted. Tandem walk is unremarkable.  Assessment and Plan:  In summary, Makenli Derstine is a very pleasant 30 y.o.-year old female with an underlying medical history of allergies and seasonal asthma, as well as obesity, who  reports involuntary jerking movements in her sleep, sometimes associated with breathing pauses while asleep, differential diagnosis includes PLMS, sleep starts, sleep apnea. She is at risk for obstructive sleep apnea given her history, and physical exam. She denies any telltale symptoms of restless leg syndrome. Her history is complicated by her shift work, she does report sleeping at night during her night soft and we will arrange for a sleep study on one of those nights. She would prefer to do a nighttime sleep study. I had a long chat with the patient and her husband about my findings and the diagnosis of OSA and PLMs, the prognosis and treatment options. I explained in particular the risks and ramifications of untreated moderate to severe OSA, especially with respect to developing cardiovascular disease down the Road, including congestive heart failure, difficult to treat hypertension, cardiac arrhythmias, or stroke. Even type 2 diabetes has,  in part, been linked to untreated OSA. Symptoms of untreated OSA include daytime sleepiness, memory problems, mood irritability and mood disorder such as depression and anxiety, lack of energy, as well as recurrent headaches, especially morning headaches. We talked about trying to maintain a healthy lifestyle in general, as well as the importance of weight control. I encouraged the patient to eat healthy, exercise daily and keep well hydrated, to keep a scheduled bedtime and wake time routine, to not skip any meals and eat healthy snacks in between meals. I advised the patient not to drive when feeling sleepy. She is hoping to transfer to the [place] in [place] which will cut down her commute to 45 minutes. I recommended the following at this time: sleep study with potential positive airway  pressure titration. (We will score hypopneas at 4% and split the sleep study into diagnostic and treatment portion, if the estimated. 2 hour AHI is >15/h).   I explained the sleep test procedure to the patient and also outlined possible surgical and non-surgical treatment options of OSA, including the use of a custom-made dental device (which would require a referral to a specialist dentist or oral surgeon), upper airway surgical options, such as pillar implants, radiofrequency surgery, tongue base surgery, and UPPP (which would involve a referral to an ENT surgeon). Rarely, jaw surgery such as mandibular advancement may be considered.  I also explained the CPAP treatment option to the patient, who indicated that she would be willing to try CPAP if the need arises. I explained the importance of being compliant with PAP treatment, not only for insurance purposes but primarily to improve Her symptoms, and for the patient's long term health benefit, including to reduce Her cardiovascular risks. I answered all their questions today and the patient and her husband were in agreement. I would like to see her back after the sleep  study is completed and encouraged her to call with any interim questions, concerns, problems or updates.   Thank you very much for allowing me to participate in the care of this nice patient. If I can be of any further assistance to you please do not hesitate to call me at [phone].  Sincerely,   Huston FoleySaima Dreden Rivere, MD, PhD

## 2015-10-12 ENCOUNTER — Emergency Department (HOSPITAL_COMMUNITY)
Admission: EM | Admit: 2015-10-12 | Discharge: 2015-10-12 | Disposition: A | Discharge status: Home / Self Care | Payer: Managed Care, Other (non HMO) | Attending: Emergency Medicine | Admitting: Emergency Medicine

## 2015-10-12 ENCOUNTER — Encounter (HOSPITAL_COMMUNITY): Payer: Self-pay | Admitting: Emergency Medicine

## 2015-10-12 DIAGNOSIS — B353 Tinea pedis: Secondary | ICD-10-CM | POA: Diagnosis not present

## 2015-10-12 DIAGNOSIS — R21 Rash and other nonspecific skin eruption: Secondary | ICD-10-CM | POA: Diagnosis present

## 2015-10-12 DIAGNOSIS — J4540 Moderate persistent asthma, uncomplicated: Secondary | ICD-10-CM | POA: Diagnosis not present

## 2015-10-12 MED ORDER — TERBINAFINE HCL 1 % EX SOLN: CUTANEOUS | 0 refills | Status: DC

## 2015-10-12 NOTE — ED Provider Notes (Signed)
[facility]-EMER[facility]ENCY DEPT Provider Note   CSN: [id number] Arrival date & time: 10/12/15  2011  First Provider Contact:  First MD Initiated Contact with Patient  10/12/2015 8:54 PM   By signing my name below, I, Stann Ore, attest that this documentation has been prepared under the direction and in the presence of Sergei Delo, PA-C.  Electronically Signed: Stann Ore, Scribe. 10/12/2015 , 8:54 PM .   History   Chief Complaint Chief Complaint  Patient presents with  . Rash    HPI Kim White is a 30 y.o. female.  HPI  Patient complains of a gradual onset rash under the bottom of her feet initially noticed a month ago. The rash has spread in between her toes, and noticed a blister in between her right great toe and 2nd toe. She walks around barefoot around at home. She reports bleeding from the rash, "only if she messes with the affected areas". She has occasional pain in the affected as well; however, she denies any pain today. She usually wears leg compression stockings because her legs tend to swell up. She denies applying anything over the rash. She denies any fever or chills, or swelling or numbness. She denies any itching or stinging over the rash.   Past Medical History:  Diagnosis Date  . Allergy   . Seasonal asthma     Patient Active Problem List   Diagnosis Date Noted  . Moderate persistent asthma 04/29/2015  . 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 Perennial and seasonal allergic rhinoconjunctivitis 04/29/2015    Past Surgical History:  Procedure Laterality Date  . child birth  [birth year]/2011  . HERNIA REPAIR      OB History    Gravida Para Term Preterm AB Living   1 1 1          SAB TAB Ectopic Multiple Live Births                   Home Medications    Prior to Admission medications   Medication Sig Start Date End Date Taking? Authorizing Provider  albuterol (PROAIR HFA) 108 (90 Base) MCG/ACT inhaler Inhale 2 puffs into the lungs every 4  (four) hours as needed for wheezing or shortness of breath (cough). Reported on 05/11/2015    Historical Provider, MD  EPINEPHrine (EPIPEN 2-PAK) 0.3 mg/0.3 mL IJ SOAJ injection Inject 0.3 mg into the muscle once. Reported on 05/11/2015    Historical Provider, MD  levonorgestrel (MIRENA) 20 MCG/24HR IUD 1 each by Intrauterine route once.    Historical Provider, MD  mometasone-formoterol (DULERA) 200-5 MCG/ACT AERO Inhale 2 puffs into the lungs 2 (two) times daily. 05/11/15   Cristal Ford, MD    Family History Family History  Problem Relation Age of Onset  . Asthma Brother   . Hyperlipidemia Maternal Grandmother   . Cancer Maternal Grandmother     Breast  . Arthritis Mother   . Hearing loss Mother   . Miscarriages / [place] Mother   . Hyperlipidemia Maternal Grandfather   . Heart disease Maternal Grandfather     in his 38's  . Alcohol abuse Paternal Grandmother   . Stroke Father     suspected from substance  . Hypertension Father   . Cancer Other   . Allergic rhinitis Neg Hx   . Eczema Neg Hx   . Immunodeficiency Neg Hx   . Urticaria Neg Hx     Social History Social History  Substance Use Topics  .  Smoking status: Never Smoker  . Smokeless tobacco: Never Used  . Alcohol use No     Allergies   Other and Shellfish allergy   Review of Systems Review of Systems  Constitutional: Negative for chills, fatigue and fever.  Musculoskeletal: Negative for myalgias.  Skin: Positive for rash. Negative for wound.     Physical Exam Updated Vital Signs BP 134/86 (BP Location: Left Arm)   Pulse 68   Temp 98.9 F (37.2 C) (Oral)   Resp 16   Ht  (1.575 m)   Wt 205 lb (93 kg)   SpO2 100%   BMI 37.49 kg/m   Physical Exam  Constitutional: She is oriented to person, place, and time. She appears well-developed and well-nourished. No distress.  HENT:  Head: Normocephalic and atraumatic.  Eyes: EOM are normal. Pupils are equal, round, and reactive to light.    Neck: Neck supple.  Cardiovascular: Normal rate.   Pulmonary/Chest: Effort normal. No respiratory distress.  Musculoskeletal: Normal range of motion.  Neurological: She is alert and oriented to person, place, and time.  Skin: Skin is warm and dry.  Scaling of the toes of bilateral feet, mild erythema in web spaces, no edema, no open wounds  Psychiatric: She has a normal mood and affect. Her behavior is normal.  Nursing note and vitals reviewed.    ED Treatments / Results  Labs (all labs ordered are listed, but only abnormal results are displayed) Labs Reviewed - No data to display  EKG  EKG Interpretation None       Radiology No results found.  Procedures Procedures (including critical care time)  Medications Ordered in ED Medications - No data to display   Initial Impression / Assessment and Plan / ED Course  I have reviewed the triage vital signs and the nursing notes.  Pertinent labs & imaging results that were available during my care of the patient were reviewed by me and considered in my medical decision making (see chart for details).  Clinical Course    Pt well appearing.  No concerning sx's for cellulitis.  NV intact.  Pt agrees to anti-fungal, keep feet dry and PMD f/u if needed.    Final Clinical Impressions(s) / ED Diagnoses   Final diagnoses:  Tinea pedis of both feet    New Prescriptions New Prescriptions   No medications on file    I personally performed the services described in this documentation, which was scribed in my presence. The recorded information has been reviewed and is accurate.      Pauline Aus, PA-C 10/14/15 1815    Samuel Jester, DO 10/15/15 1630

## 2015-10-12 NOTE — Discharge Instructions (Signed)
Try to keep your feet dry as possible.  Follow-up with your doctor or return to ER for any worsening symptoms

## 2015-10-12 NOTE — ED Triage Notes (Signed)
Onset a month ago cut bottom of right foot on corner of floor at home.  White patches turning dark at times per pt. Painful when walking.

## 2016-01-06 ENCOUNTER — Telehealth: Payer: Self-pay | Admitting: Neurology

## 2016-01-06 DIAGNOSIS — R252 Cramp and spasm: Secondary | ICD-10-CM

## 2016-01-06 DIAGNOSIS — R0681 Apnea, not elsewhere classified: Secondary | ICD-10-CM

## 2016-01-06 DIAGNOSIS — G4761 Periodic limb movement disorder: Secondary | ICD-10-CM

## 2016-01-06 DIAGNOSIS — G4710 Hypersomnia, unspecified: Secondary | ICD-10-CM

## 2016-01-06 DIAGNOSIS — G4726 Circadian rhythm sleep disorder, shift work type: Secondary | ICD-10-CM

## 2016-01-06 DIAGNOSIS — R0683 Snoring: Secondary | ICD-10-CM

## 2016-01-06 NOTE — Telephone Encounter (Signed)
I need an order for a sleep study for this patient.  I see her documentation from the appointment however I don't have an order from Dr. Frances FurbishAthar.  Would you please check into this and submit an order for me.

## 2016-01-06 NOTE — Telephone Encounter (Signed)
error 

## 2016-01-06 NOTE — Telephone Encounter (Signed)
Order has been placed.

## 2016-01-10 ENCOUNTER — Encounter: Payer: Self-pay | Admitting: Neurology

## 2016-01-21 ENCOUNTER — Ambulatory Visit (INDEPENDENT_AMBULATORY_CARE_PROVIDER_SITE_OTHER): Payer: Managed Care, Other (non HMO) | Admitting: Neurology

## 2016-01-21 DIAGNOSIS — G4720 Circadian rhythm sleep disorder, unspecified type: Secondary | ICD-10-CM

## 2016-01-21 DIAGNOSIS — G4710 Hypersomnia, unspecified: Secondary | ICD-10-CM | POA: Diagnosis not present

## 2016-01-21 DIAGNOSIS — G4761 Periodic limb movement disorder: Secondary | ICD-10-CM

## 2016-01-27 ENCOUNTER — Telehealth: Payer: Self-pay | Admitting: Neurology

## 2016-01-27 NOTE — Telephone Encounter (Signed)
Pt called inquiring if she should make an appt to get the sleep results. I advised her there is a 10-14 day turnaround time for those results and the RN would call her and if an appt was needed she would schedule it with her then  [facility]

## 2016-01-27 NOTE — Telephone Encounter (Signed)
Noted/fim 

## 2016-02-02 ENCOUNTER — Encounter: Payer: Self-pay | Admitting: Neurology

## 2016-02-03 NOTE — Progress Notes (Signed)
Patient referred by Dr. Tanya NonesPickard, seen by me on 09/23/15, diagnostic PSG on 01/21/16.   Please call and notify the patient that the recent sleep study did not show any significant obstructive sleep apnea. 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 Please inform patient that I would like to go over the details of the study during a follow up appointment. Arrange a followup appointment. Also, route or fax report to PCP and referring MD, if other than PCP.  Once you have spoken to patient, you can close this encounter.   Thanks,  Huston FoleySaima Twanisha Foulk, MD, PhD [facility]

## 2016-02-03 NOTE — Procedures (Signed)
PATIENT'S NAME:  Kim White, [name] DOB:      [birth year]      MR#:    [id number]     DATE OF RECORDING: 01/21/2016 REFERRING M.D.:  Lynnea FerrierWarren Pickard, MD Study Performed:   Baseline Polysomnogram HISTORY:  30 year old woman with a history of allergies and seasonal asthma, as well as obesity, who reports involuntary limb movements while asleep. She is reported to twitch in her sleep by her husband. He describes jerking of her body or flailing movements of her arms and legs, sometimes associated with a gasping sound or pauses in her breathing while asleep. The patient endorsed the Epworth Sleepiness Scale at 20 points.   The patient's weight 209 pounds with a height of 62 (inches), resulting in a BMI of 38.5 kg/m2. The patient's neck circumference measured 15.8 inches.  CURRENT MEDICATIONS: albuterol, epipen, mirena, Dulera   PROCEDURE:  This is a multichannel digital polysomnogram utilizing the Somnostar 11.2 system.  Electrodes and sensors were applied and monitored per AASM Specifications.   EEG, EOG, Chin and Limb EMG, were sampled at 200 Hz.  ECG, Snore and Nasal Pressure, Thermal Airflow, Respiratory Effort, CPAP Flow and Pressure, Oximetry was sampled at 50 Hz. Digital video and audio were recorded.      BASELINE STUDY  Lights Out was at 20:47 and Lights On at 04:58.  Total recording time (TRT) was 491.5 minutes, with a total sleep time (TST) of  397 minutes.   The patient's sleep latency was 63 minutes, which is prolonged.  REM latency was 119.5 minutes, which is high normal.  The sleep efficiency was 80.8 %.     SLEEP ARCHITECTURE: WASO (Wake after sleep onset) was 37 minutes.  There were 8.5 minutes in Stage N1, 276 minutes Stage N2, 16 minutes Stage N3 and 96.5 minutes in Stage REM.  The percentage of Stage N1 was 2.1%, Stage N2 was 69.5%, which is increased, Stage N3 was 4.%, which is reduced, and Stage R (REM sleep) was 24.3%, which is normal.  Audio and video analysis did not show any  abnormal or unusual movements, behaviors, phonations or vocalizations.  The patient took 1 bathroom break. Mild intermittent snoring was noted. The EKG was in keeping with normal sinus rhythm (NSR).  RESPIRATORY ANALYSIS:  There were a total of 3 respiratory events:  1 obstructive apneas, 0 central apneas and 0 mixed apneas with a total of 1 apneas and an apnea index (AI) of .2 /hour. There were 2 hypopneas with a hypopnea index of .3 /hour. The patient also had 3 respiratory event related arousals (RERAs).      The total APNEA/HYPOPNEA INDEX (AHI) was .5/hour and the total RESPIRATORY DISTURBANCE INDEX was .9 /hour.  2 events occurred in REM sleep and 0 events in NREM. The REM AHI was 1.2 /hour, versus a non-REM AHI of .2. The patient spent 296.5 minutes of total sleep time in the supine position and 101 minutes in non-supine.. The supine AHI was 0.4 versus a non-supine AHI of 0.6.  OXYGEN SATURATION & C02:  The Wake baseline 02 saturation was 98%, with the lowest being 91%. Time spent below 89% saturation equaled 0 minutes.   PERIODIC LIMB MOVEMENTS:   The patient had a total of 198 Periodic Limb Movements.  The Periodic Limb Movement (PLM) index was 29.9 and the PLM Arousal index was 0/hour.  Post-study, the patient indicated that sleep was worse than usual.   IMPRESSION:  1. Periodic Limb Movement Disorder (PLMD) 2. Dysfunctions associated  with sleep stages or arousal from sleep  RECOMMENDATIONS:  1. This study does not demonstrate any significant obstructive or central sleep disordered breathing. Moderate PLMs (periodic limb movements of sleep) were noted during the study without significant arousals; clinical correlation is recommended.  2. This study shows sleep fragmentation and abnormal sleep stage percentages; these are nonspecific findings and per se do not signify an intrinsic sleep disorder or a cause for the patient's sleep-related symptoms. Causes include (but are not limited  to) the first night effect of the sleep study, circadian rhythm disturbances, medication effect or an underlying mood disorder or medical problem.  3. The patient should be cautioned not to drive, work at heights, or operate dangerous or heavy equipment when tired or sleepy. Review and reiteration of good sleep hygiene measures should be pursued with any patient. 4. The patient will be seen in [facility] for discussion of the test results and further management strategies. The referring provider will be notified of the test results.   I certify that I have reviewed the entire raw data recording prior to the issuance of this report in accordance with the Standards of Accreditation of the American Academy of Sleep Medicine (AASM)     Huston FoleySaima Gyan Cambre, MD, PhD Diplomat, American Board of Psychiatry and Neurology  Diplomat, American Board of Sleep Medicine

## 2016-02-08 ENCOUNTER — Telehealth: Payer: Self-pay

## 2016-02-08 NOTE — Telephone Encounter (Signed)
I spoke to patient and she is aware of results and recommendations. We were able to make f/u appt. I have sent copy of report to PCP.

## 2016-02-08 NOTE — Telephone Encounter (Signed)
-----   Message from Huston FoleySaima Athar, MD sent at 02/03/2016  6:59 PM EST ----- Patient referred by Dr. Tanya NonesPickard, seen by me on 09/23/15, diagnostic PSG on 01/21/16.   Please call and notify the patient that the recent sleep study did not show any significant obstructive sleep apnea. 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 Please inform patient that I would like to go over the details of the study during a follow up appointment. Arrange a followup appointment. Also, route or fax report to PCP and referring MD, if other than PCP.  Once you have spoken to patient, you can close this encounter.   Thanks,  Huston FoleySaima Athar, MD, PhD [facility])

## 2016-02-15 ENCOUNTER — Encounter (HOSPITAL_COMMUNITY): Payer: Self-pay | Admitting: Emergency Medicine

## 2016-02-15 ENCOUNTER — Emergency Department (HOSPITAL_COMMUNITY)
Admission: EM | Admit: 2016-02-15 | Discharge: 2016-02-15 | Disposition: A | Discharge status: Home / Self Care | Payer: Managed Care, Other (non HMO) | Attending: Emergency Medicine | Admitting: Emergency Medicine

## 2016-02-15 ENCOUNTER — Emergency Department (HOSPITAL_COMMUNITY): Payer: Managed Care, Other (non HMO)

## 2016-02-15 DIAGNOSIS — Z79899 Other long term (current) drug therapy: Secondary | ICD-10-CM | POA: Insufficient documentation

## 2016-02-15 DIAGNOSIS — M25511 Pain in right shoulder: Secondary | ICD-10-CM | POA: Insufficient documentation

## 2016-02-15 DIAGNOSIS — J45909 Unspecified asthma, uncomplicated: Secondary | ICD-10-CM | POA: Diagnosis not present

## 2016-02-15 MED ORDER — TRAMADOL HCL 50 MG PO TABS: ORAL_TABLET | ORAL | 0 refills | Status: DC

## 2016-02-15 MED ORDER — NAPROXEN 500 MG PO TABS: ORAL_TABLET | ORAL | 0 refills | Status: DC

## 2016-02-15 NOTE — Discharge Instructions (Signed)
Follow-up with your family doctor next week if not improving °

## 2016-02-15 NOTE — ED Provider Notes (Signed)
[facility]-EMERG[facility]Y DEPT Provider Note   CSN: [id number] Arrival date & time: 02/15/16  [id number]  By signing my name below, I, Majel HomerPeyton Lee, attest that this documentation has been prepared under the direction and in the presence of Bethann BerkshireJoseph Memphis Creswell, MD . Electronically Signed: Majel HomerPeyton Lee, Scribe. 02/15/2016. 8:32 AM.  History   Chief Complaint Chief Complaint  Patient presents with  . Shoulder Pain   Patient complains of pain in her right shoulder   The history is provided by the patient. No language interpreter was used.  Shoulder Pain   This is a new problem. The current episode started more than 1 week ago. The problem has been gradually worsening. The pain is present in the right shoulder. The pain is at a severity of 7/10. The pain is mild. She has tried nothing for the symptoms.   HPI Comments: Kim White is a 30 y.o. female who presents to the Emergency Department complaining of gradually worsening, right shoulder pain that began 2 weeks ago. Pt reports her pain radiates into her upper back and is exacerbated with movement. She states she has not taken any medication for her pain. Pt notes she currently works as a [employer] but denies recent injury or heavy lifting.   Past Medical History:  Diagnosis Date  . Allergy   . Seasonal asthma     Patient Active Problem List   Diagnosis Date Noted  . Moderate persistent asthma 04/29/2015  . 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 Perennial and seasonal allergic rhinoconjunctivitis 04/29/2015    Past Surgical History:  Procedure Laterality Date  . child birth  05/05/2011  . HERNIA REPAIR      OB History    Gravida Para Term Preterm AB Living   1 1 1          SAB TAB Ectopic Multiple Live Births                 Home Medications    Prior to Admission medications   Medication Sig Start Date End Date Taking? Authorizing Provider  albuterol (PROAIR HFA) 108 (90 Base) MCG/ACT inhaler Inhale 2 puffs into the lungs every 4  (four) hours as needed for wheezing or shortness of breath (cough). Reported on 05/11/2015    Historical Provider, MD  EPINEPHrine (EPIPEN 2-PAK) 0.3 mg/0.3 mL IJ SOAJ injection Inject 0.3 mg into the muscle once. Reported on 05/11/2015    Historical Provider, MD  levonorgestrel (MIRENA) 20 MCG/24HR IUD 1 each by Intrauterine route once.    Historical Provider, MD  mometasone-formoterol (DULERA) 200-5 MCG/ACT AERO Inhale 2 puffs into the lungs 2 (two) times daily. 05/11/15   Cristal Fordalph Carter Bobbitt, MD  Terbinafine HCl (LAMISIL SPRAY) 1 % SOLN Apply twice daily to affected areas of your feet 10/12/15   Pauline Ausammy Triplett, PA-C    Family History Family History  Problem Relation Age of Onset  . Asthma Brother   . Hyperlipidemia Maternal Grandmother   . Cancer Maternal Grandmother     Breast  . Arthritis Mother   . Hearing loss Mother   . Miscarriages / [place] Mother   . Hyperlipidemia Maternal Grandfather   . Heart disease Maternal Grandfather     in his 3940's  . Alcohol abuse Paternal Grandmother   . Stroke Father     suspected from substance  . Hypertension Father   . Cancer Other   . Allergic rhinitis Neg Hx   . Eczema Neg Hx   . Immunodeficiency Neg Hx   .  Urticaria Neg Hx     Social History Social History  Substance Use Topics  . Smoking status: Never Smoker  . Smokeless tobacco: Never Used  . Alcohol use No     Allergies   Other and Shellfish allergy  Review of Systems Review of Systems  Constitutional: Negative for appetite change and fatigue.  HENT: Negative for congestion, ear discharge and sinus pressure.   Eyes: Negative for discharge.  Respiratory: Negative for cough.   Cardiovascular: Negative for chest pain.  Gastrointestinal: Negative for abdominal pain and diarrhea.  Genitourinary: Negative for frequency and hematuria.  Musculoskeletal: Positive for arthralgias (right shoulder).  Skin: Negative for rash.  Neurological: Negative for seizures and headaches.    Psychiatric/Behavioral: Negative for hallucinations.   Physical Exam Updated Vital Signs BP 124/81 (BP Location: Left Arm)   Pulse 71   Temp 98.5 F (36.9 C) (Oral)   Resp 18   Ht 5\' 2"  (1.575 m)   Wt 206 lb (93.4 kg)   LMP 01/29/2016   SpO2 100%   BMI 37.68 kg/m   Physical Exam  Constitutional: She is oriented to person, place, and time. She appears well-developed.  HENT:  Head: Normocephalic.  Eyes: Conjunctivae are normal.  Neck: No tracheal deviation present.  Cardiovascular:  No murmur heard. Musculoskeletal: Normal range of motion. She exhibits tenderness.  Mild tenderness to right shoulder.   Neurological: She is oriented to person, place, and time.  Skin: Skin is warm.  Psychiatric: She has a normal mood and affect.   ED Treatments / Results  Labs (all labs ordered are listed, but only abnormal results are displayed) Labs Reviewed - No data to display  EKG  EKG Interpretation None       Radiology Dg Shoulder Right  Result Date: 02/15/2016 CLINICAL DATA:  Shoulder pain.  No known injury. EXAM: RIGHT SHOULDER - 2+ VIEW COMPARISON:  CT 07/04/2014. FINDINGS: No acute bony or joint abnormality identified. No evidence of fracture or dislocation. IMPRESSION: No acute abnormality . Electronically Signed   By: Maisie Fushomas  Register   On: 02/15/2016 09:04    Procedures Procedures (including critical care time)  Medications Ordered in ED Medications - No data to display  DIAGNOSTIC STUDIES:  Oxygen Saturation is 100% on RA, normal by my interpretation.    COORDINATION OF CARE:  8:29 AM Discussed treatment plan with pt at bedside and pt agreed to plan.  Initial Impression / Assessment and Plan / ED Course  I have reviewed the triage vital signs and the nursing notes.  Pertinent labs & imaging results that were available during my care of the patient were reviewed by me and considered in my medical decision making (see chart for details).  Clinical Course      Patient with painful right shoulder x-ray unremarkable. She will be put on Naprosyn for and given some Ultram as needed patient will be put in a sling for a few days and will follow-up with PCP  Final Clinical Impressions(s) / ED Diagnoses   Final diagnoses:  None    New Prescriptions New Prescriptions   No medications on file  .jh The chart was scribed for me under my direct supervision.  I personally performed the history, physical, and medical decision making and all procedures in the evaluation of this patient.Bethann Berkshire.    Masson Nalepa, MD 02/15/16 1000

## 2016-02-15 NOTE — ED Triage Notes (Signed)
PT c/o right shoulder pain with ROM x2 weeks with no reported injury.

## 2016-03-01 ENCOUNTER — Encounter: Payer: Self-pay | Admitting: Neurology

## 2016-03-01 ENCOUNTER — Ambulatory Visit (INDEPENDENT_AMBULATORY_CARE_PROVIDER_SITE_OTHER): Payer: Managed Care, Other (non HMO) | Admitting: Neurology

## 2016-03-01 VITALS — BP 121/82 | HR 83 | Resp 20 | Ht 62.0 in | Wt 214.0 lb

## 2016-03-01 DIAGNOSIS — G4761 Periodic limb movement disorder: Secondary | ICD-10-CM | POA: Diagnosis not present

## 2016-03-01 DIAGNOSIS — G4726 Circadian rhythm sleep disorder, shift work type: Secondary | ICD-10-CM

## 2016-03-01 DIAGNOSIS — R400 Somnolence: Secondary | ICD-10-CM

## 2016-03-01 MED ORDER — PRAMIPEXOLE DIHYDROCHLORIDE 0.125 MG PO TABS: ORAL_TABLET | ORAL | 3 refills | Status: DC

## 2016-03-01 NOTE — Progress Notes (Signed)
Subjective:    Patient ID: Taquilla Downum is a 30 y.o. female.  HPI     Interim history:   Ms. Eustice is a very pleasant 30 year old right-handed woman with an underlying medical history of allergies and seasonal asthma, as well as obesity, who presents for follow-up consultation of her sleep disturbance, after her recent sleep study. The patient is unaccompanied today. I first met her on 09/23/2015 at the request of her primary care physician, at which time she reported involuntary limb movements while asleep. I invited her for a sleep study, she had a baseline sleep study on 01/21/2016. I went over her test results with her in detail today. Sleep efficiency was 80.8% with a sleep latency of 63 minutes which is prolonged, REM latency was high normal at 119.5 minutes. She had an increased percentage of stage II sleep, a decreased percentage of slow-wave sleep and normal amount of REM sleep at 24.3%. She had mild intermittent snoring. She had a total AHI of 0.5 per hour, REM AHI of 1.2 per hour, average oxygen saturation was 98%, nadir was 91%. She had mild PLMS with an index of 29.9 per hour, with an associated arousal index of 0 per hour.  Today, 03/01/2016: She reports no new symptoms, but has EDS, works in [place], but lives in [place]. For the past 2 months, she has been staying with a friend during her work week, which has cut down on her commute to 1 hour each way from previously 3 hours each way. Kids are at home with husband and when he is at work, patient's mother helps out with the kids. No Hx of anemia or iron deficiency. No significant RLS symptoms, but leg movements bother husband and she does rub her feet together to go to sleep. Patient's grandmother had restless leg syndrome. She denies any other parasomnias. She has a family history of complicated migraines in her mother about herself has not suffered from any serious headaches. The time during the workweek is around 10:30 AM and  wakeup time is around 5 PM. Sometimes she takes a nap before going to work.   Previously:  09/23/2015: She reports involuntary limb movements while asleep. She is reported to twitch in her sleep by her husband. He describes jerking of her body or flailing movements of her arms and legs, sometimes associated with a gasping sound or pauses in her breathing while asleep. She does not endorse any gasping sensation while asleep. She does snore loudly per husband. She denies any nocturia. She denies restless leg symptoms. She is not aware of any family history of OSA or restless leg syndrome. She denies any involuntary movements during wakefulness or prior to falling asleep. She has been working third shift for the past 6 months. 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 She has a three-hour commute to work. She works from 11:30 PM to 8 AM. She gets home around 11 AM and goes to bed usually around noon. During her nights off she sleeps at night. She does not report sleepiness at the wheel. She has been married for 6 years. Her husband reports nearly nightly twitching. She has a family history of epilepsy (uncle and first cousin), but no personal history of seizures or epilepsy. Her mother has basilar migraines, she herself does not suffer from migraine headaches. She has frequent morning headaches though, these are transient and dull and achy, global. She does not take medication for these. I reviewed your office note  from 08/28/2015. She lives at home with her husband and 2 boys. She does not smoke or drink alcohol or drink caffeine, she does not use illicit drugs. She denies sleep attacks but is sleepy when sedentary. Denies episodes of weakness or cataplexy and denies episodes of hallucinations or dream sequences.  Her Past Medical History Is Significant For: Past Medical History:  Diagnosis Date  . Allergy   . Seasonal asthma     Her Past Surgical History Is Significant For: Past Surgical  History:  Procedure Laterality Date  . child birth  05/05/2011  . HERNIA REPAIR      Her Family History Is Significant For: Family History  Problem Relation Age of Onset  . Asthma Brother   . Hyperlipidemia Maternal Grandmother   . Cancer Maternal Grandmother     Breast  . Arthritis Mother   . Hearing loss Mother   . Miscarriages / [place] Mother   . Hyperlipidemia Maternal Grandfather   . Heart disease Maternal Grandfather     in his 45's  . Alcohol abuse Paternal Grandmother   . Stroke Father     suspected from substance  . Hypertension Father   . Cancer Other   . Allergic rhinitis Neg Hx   . Eczema Neg Hx   . Immunodeficiency Neg Hx   . Urticaria Neg Hx     Her Social History Is Significant For: Social History   Social History  . Marital status: Married    Spouse name: N/A  . Number of children: 2  . Years of education: HS   Social History Main Topics  . Smoking status: Never Smoker  . Smokeless tobacco: Never Used  . Alcohol use No  . Drug use: No  . Sexual activity: Yes    Birth control/ protection: IUD     Comment: married, 2 boys, works at [employer] hospital   Other Topics Concern  . None   Social History Narrative   Denies caffeine use     Her Allergies Are:  Allergies  Allergen Reactions  . Other Nausea And Vomiting    ALL TREE NUTS  . Shellfish Allergy Swelling  :   Her Current Medications Are:  Outpatient Encounter Prescriptions as of 03/01/2016  Medication Sig  . albuterol (PROAIR HFA) 108 (90 Base) MCG/ACT inhaler Inhale 2 puffs into the lungs every 4 (four) hours as needed for wheezing or shortness of breath (cough). Reported on 05/11/2015  . EPINEPHrine (EPIPEN 2-PAK) 0.3 mg/0.3 mL IJ SOAJ injection Inject 0.3 mg into the muscle once. Reported on 05/11/2015  . levonorgestrel (MIRENA) 20 MCG/24HR IUD 1 each by Intrauterine route once.  . mometasone-formoterol (DULERA) 200-5 MCG/ACT AERO Inhale 2 puffs into the lungs 2 (two) times daily.   . [DISCONTINUED] naproxen (NAPROSYN) 500 MG tablet Take one bid as needed for pain  . [DISCONTINUED] Terbinafine HCl (LAMISIL SPRAY) 1 % SOLN Apply twice daily to affected areas of your feet  . [DISCONTINUED] traMADol (ULTRAM) 50 MG tablet Take one every 6hours for pain not helped by naprosyn   No facility-administered encounter medications on file as of 03/01/2016.   :  Review of Systems:  Out of a complete 14 point review of systems, all are reviewed and negative with the exception of these symptoms as listed below:  Review of Systems  Neurological:       Pt presents today to discuss her sleep study results.    Objective:  Neurologic Exam  Physical Exam Physical Examination:  Vitals:   03/01/16 1513  BP: 121/82  Pulse: 83  Resp: 20   General Examination: The patient is a very pleasant 30 y.o. female in no acute distress. She appears well-developed and well-nourished and well groomed. Good spirits today.  HEENT: Normocephalic, atraumatic, pupils are equal, round and reactive to light and accommodation. Extraocular tracking is good without limitation to gaze excursion or nystagmus noted. Normal smooth pursuit is noted. Hearing is grossly intact. Eye glasses in place. Face is symmetric with normal facial animation and normal facial sensation. Speech is clear with no dysarthria noted. There is no hypophonia. There is no lip, neck/head, jaw or voice tremor. Neck is supple with full range of passive and active motion. There are no carotid bruits on auscultation. Oropharynx exam reveals: mild mouth dryness, good dental hygiene and mild airway crowding, due to wider tongue, redundant soft palate, tonsils in place, about 1-2+ bilaterally. Mallampati is class I. She has a mild overbite.  Chest: Clear to auscultation without wheezing, rhonchi or crackles noted.  Heart: S1+S2+0, regular and normal without murmurs, rubs or gallops noted.   Abdomen: Soft, non-tender and non-distended with  normal bowel sounds appreciated on auscultation.  Extremities: There is no pitting edema in the distal lower extremities bilaterally. Pedal pulses are intact.  Skin: Warm and dry without trophic changes noted. There are no varicose veins.  Musculoskeletal: exam reveals no obvious joint deformities, tenderness or joint swelling or erythema.   Neurologically:  Mental status: The patient is awake, alert and oriented in all 4 spheres. Her immediate and remote memory, attention, language skills and fund of knowledge are appropriate. There is no evidence of aphasia, agnosia, apraxia or anomia. Speech is clear with normal prosody and enunciation. Thought process is linear. Mood is normal and affect is normal.  Cranial nerves II - XII are as described above under HEENT exam. In addition: shoulder shrug is normal with equal shoulder height noted. Motor exam: Normal bulk, strength and tone is noted. There is no drift, tremor or rebound. Romberg is negative. Reflexes are 2+ throughout. Fine motor skills and coordination: intact with normal finger taps, normal hand movements, normal rapid alternating patting, normal foot taps and normal foot agility.  Cerebellar testing: No dysmetria or intention tremor on finger to nose testing. Heel to shin is unremarkable bilaterally. There is no truncal or gait ataxia.  Sensory exam: intact to light touch in the upper and lower extremities.  Gait, station and balance: She stands easily. No veering to one side is noted. No leaning to one side is noted. Posture is age-appropriate and stance is narrow based. Gait shows normal stride length and normal pace. No problems turning are noted. Tandem walk is unremarkable.  Assessment and Plan:  In summary, Marybel Alcott is a very pleasant 30 year old female with an underlying medical history of allergies and seasonal asthma, as well as obesity, who presents for follow-up consultation of her sleep disorder, including periodic leg  movements of sleep, daytime somnolence, particularly in the context of shift work. Her situation is complicated by her long commute of about 3 hours one way but thankfully, in the past couple of months she has been able to stay with a friend during her work week which cut down her commute to 1 hour each way. We talked about her sleep study results in detail today. She did not have any significant obstructive sleep disordered breathing or significant snoring, oxygen was above 90 throughout the night. 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 She had a normal amount of REM sleep, high normal REM latency. Sleep efficiency was fairly good at 80%. Her history is not telltale for narcolepsy. She does not have any significant symptoms of restless leg syndrome but her leg movements are bothersome to her husband and she does have a tendency to rub her feet before falling asleep. We talked about the importance of allowing enough sleep time. We talked about sleep hygiene in general. For her PLMs and mild restless leg symptoms I suggested we try her on low-dose generic Mirapex with gradual titration. If sleep is better consolidated secondary to less movement disruption, she is expected to have less daytime somnolence. She is agreeable to trying this. To that end, I suggested pramipexole 0.125 mg strength one pill in the morning about 90 minutes before her sleep time, around 9 AM during the workweek and she can increase this by one pill every week to up to 3 pills. We do have room to increase after that. We talked about potential side effects, including sleepiness from the medication. I provided written instructions and a new prescription. I suggested a 4 month checkup, sooner as needed. I answered all her questions today and she was in agreement. I spent 25 minutes in total face-to-face time with the patient, more than 50% of which was spent in counseling and coordination of care, reviewing test  results, reviewing medication and discussing or reviewing the diagnosis of PLMD, RLS, the prognosis and treatment options.

## 2016-03-01 NOTE — Patient Instructions (Signed)
Your sleep study did not show any significant obstructive sleep apnea or snoring.  You had mild leg twitching in sleep, which did not disturb your sleep very much.  You should be monitored for restless legs symptoms. Keep in mind restless legs syndrome (RLS) is associated with anemia and iron deficiency.  Please remember to try to maintain good sleep hygiene, which means: Keep a regular sleep and wake schedule, try not to exercise or have a meal within 2 hours of your bedtime, try to keep your bedroom conducive for sleep, that is, cool and dark, without light distractors such as an illuminated alarm clock, and refrain from watching TV right before sleep or in the middle of the night and do not keep the TV or radio on during the night. Also, try not to use or play on electronic devices at bedtime, such as your cell phone, tablet PC or laptop. If you like to read at bedtime on an electronic device, try to dim the background light as much as possible. Do not eat in the middle of the night.   For your leg movements in sleep, we will try Mirapex (generic name: pramipexole) 0.125 mg: Take 1 pill each day for 1 week, the 2 pills each day for 1 week, then 3 pills each day thereafter. Common side effects reported are: Sedation, sleepiness, nausea, vomiting, and rare side effects are confusion, hallucinations, swelling in legs, and abnormal behaviors, including impulse control problems, which can manifest as excessive eating, obsessions with food or gambling, or hypersexuality.  Take 90 minutes before sleep time, around 9 AM for your work week. Otherwise, if you plan to sleep at night, 90 min before bedtime.

## 2016-05-09 ENCOUNTER — Encounter: Payer: Self-pay | Admitting: Family Medicine

## 2016-05-09 ENCOUNTER — Ambulatory Visit (INDEPENDENT_AMBULATORY_CARE_PROVIDER_SITE_OTHER): Payer: Managed Care, Other (non HMO) | Admitting: Family Medicine

## 2016-05-09 VITALS — BP 132/94 | HR 58 | Temp 98.1°F | Resp 16 | Ht 62.0 in | Wt 214.0 lb

## 2016-05-09 DIAGNOSIS — E8809 Other disorders of plasma-protein metabolism, not elsewhere classified: Secondary | ICD-10-CM | POA: Diagnosis not present

## 2016-05-09 DIAGNOSIS — B353 Tinea pedis: Secondary | ICD-10-CM

## 2016-05-09 MED ORDER — TERBINAFINE HCL 250 MG PO TABS: 250.0000 mg | ORAL_TABLET | Freq: Every day | ORAL | 0 refills | Status: DC

## 2016-05-09 NOTE — Progress Notes (Signed)
Subjective:    Patient ID: Kim White, female    DOB: [birth year], 31 y.o.   MRN: [id number]  HPI Patient donates plasma. 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 She is not exactly sure. She denies any fevers or chills or weight loss. She denies any bleeding or bruising. She denies any night sweats. She was also recently diagnosed with tinea pedis and was treated with Lotrimin cream twice daily. She took the cream for 2 weeks without benefit. She has an erythematous rash on the plantar aspects of both feet. There is no discernible plantar wart. There are numerous scaly papules coalescing into a large plaque. The scaly papules are small less than 1 mm in diameter. Past Medical History:  Diagnosis Date  . Allergy   . Seasonal asthma    Past Surgical History:  Procedure Laterality Date  . child birth  05/05/2011  . HERNIA REPAIR     Current Outpatient Prescriptions on File Prior to Visit  Medication Sig Dispense Refill  . albuterol (PROAIR HFA) 108 (90 Base) MCG/ACT inhaler Inhale 2 puffs into the lungs every 4 (four) hours as needed for wheezing or shortness of breath (cough). Reported on 05/11/2015    . EPINEPHrine (EPIPEN 2-PAK) 0.3 mg/0.3 mL IJ SOAJ injection Inject 0.3 mg into the muscle once. Reported on 05/11/2015    . levonorgestrel (MIRENA) 20 MCG/24HR IUD 1 each by Intrauterine route once.    . mometasone-formoterol (DULERA) 200-5 MCG/ACT AERO Inhale 2 puffs into the lungs 2 (two) times daily. 1 Inhaler 2  . pramipexole (MIRAPEX) 0.125 MG tablet 1 pill daily x 1 week, then 2 pills each day x 1 week, then 3 pills each day thereafter. Take 90-120 minutes before bedtime, for you: at 9AM. 90 tablet 3   No current facility-administered medications on file prior to visit.    Allergies  Allergen Reactions  . Other Nausea And Vomiting    ALL TREE NUTS  . Shellfish Allergy Swelling   Social History   Social  History  . Marital status: Married    Spouse name: N/A  . Number of children: 2  . Years of education: HS   Occupational History  . Not on file.   Social History Main Topics  . Smoking status: Never Smoker  . Smokeless tobacco: Never Used  . Alcohol use No  . Drug use: No  . Sexual activity: Yes    Birth control/ protection: IUD     Comment: married, 2 boys, works at [employer] hospital   Other Topics Concern  . Not on file   Social History Narrative   Denies caffeine use       Review of Systems  All other systems reviewed and are negative.      Objective:   Physical Exam  Constitutional: She appears well-developed and well-nourished.  Cardiovascular: Normal rate, regular rhythm and normal heart sounds.   Pulmonary/Chest: Effort normal and breath sounds normal. No respiratory distress. She has no wheezes. She has no rales.  Abdominal: Soft. Bowel sounds are normal.  Lymphadenopathy:    She has no cervical adenopathy.  Skin: Rash noted.  Vitals reviewed.         Assessment & Plan:  Tinea pedis of both feet - Plan: terbinafine (LAMISIL) 250 MG tablet  Dysproteinemia - Plan: Protein electrophoresis, serum, COMPLETE METABOLIC PANEL WITH GFR, CBC with Differential/Platelet  Patient has failed topical therapy for 2  weeks. We will try Lamisil 250 mg by mouth daily for 14 days. I am not certain with a dysproteinemia is that the patient was diagnosed with. This was obtained on a screening "SPEP". She only gets this to the fact she donates blood. Therefore I would like to repeat an SPEP so I can see the exact abnormality and determine if this is anything that warrants further workup.

## 2016-05-10 LAB — COMPLETE METABOLIC PANEL WITH GFR
ALT: 19 U/L (ref 6–29)
AST: 18 U/L (ref 10–30)
Albumin: 3.9 g/dL (ref 3.6–5.1)
Alkaline Phosphatase: 39 U/L (ref 33–115)
BILIRUBIN TOTAL: 0.3 mg/dL (ref 0.2–1.2)
BUN: 13 mg/dL (ref 7–25)
CALCIUM: 9.4 mg/dL (ref 8.6–10.2)
CO2: 22 mmol/L (ref 20–31)
CREATININE: 0.94 mg/dL (ref 0.50–1.10)
Chloride: 107 mmol/L (ref 98–110)
GFR, Est Non African American: 83 mL/min (ref >=60)
Glucose, Bld: 80 mg/dL (ref 70–99)
Potassium: 4.2 mmol/L (ref 3.5–5.3)
Sodium: 140 mmol/L (ref 135–146)
TOTAL PROTEIN: 7.4 g/dL (ref 6.1–8.1)

## 2016-05-10 LAB — CBC WITH DIFFERENTIAL/PLATELET
BASOS ABS: 0 {cells}/uL (ref 0–200)
Basophils Relative: 0 %
Eosinophils Absolute: 250 cells/uL (ref 15–500)
Eosinophils Relative: 5 %
HCT: 41.1 % (ref 35.0–45.0)
Hemoglobin: 13.3 g/dL (ref 12.0–15.0)
LYMPHS PCT: 38 %
Lymphs Abs: 1900 cells/uL (ref 850–3900)
MCH: 26.9 pg — AB (ref 27.0–33.0)
MCHC: 32.4 g/dL (ref 32.0–36.0)
MCV: 83 fL (ref 80.0–100.0)
MONOS PCT: 10 %
MPV: 11.4 fL (ref 7.5–12.5)
Monocytes Absolute: 500 cells/uL (ref 200–950)
NEUTROS PCT: 47 %
Neutro Abs: 2350 cells/uL (ref 1500–7800)
Platelets: 222 10*3/uL (ref 140–400)
RBC: 4.95 MIL/uL (ref 3.80–5.10)
RDW: 14.3 % (ref 11.0–15.0)
WBC: 5 10*3/uL (ref 3.8–10.8)

## 2016-05-11 LAB — PROTEIN ELECTROPHORESIS, SERUM
Albumin ELP: 3.7 g/dL — ABNORMAL LOW (ref 3.8–4.8)
Alpha-1-Globulin: 0.3 g/dL (ref 0.2–0.3)
Alpha-2-Globulin: 0.7 g/dL (ref 0.5–0.9)
Beta 2: 0.7 g/dL — ABNORMAL HIGH (ref 0.2–0.5)
Beta Globulin: 0.5 g/dL (ref 0.4–0.6)
Gamma Globulin: 1.5 g/dL (ref 0.8–1.7)
TOTAL PROTEIN, SERUM ELECTROPHOR: 7.4 g/dL (ref 6.1–8.1)

## 2016-06-07 ENCOUNTER — Encounter: Payer: Self-pay | Admitting: Family Medicine

## 2016-06-07 ENCOUNTER — Ambulatory Visit (INDEPENDENT_AMBULATORY_CARE_PROVIDER_SITE_OTHER): Payer: Managed Care, Other (non HMO) | Admitting: Family Medicine

## 2016-06-07 VITALS — BP 100/68 | HR 68 | Temp 98.6°F | Resp 16 | Ht 62.0 in | Wt 218.0 lb

## 2016-06-07 DIAGNOSIS — L301 Dyshidrosis [pompholyx]: Secondary | ICD-10-CM

## 2016-06-07 MED ORDER — MOMETASONE FUROATE 0.1 % EX OINT: TOPICAL_OINTMENT | Freq: Every day | CUTANEOUS | 0 refills | Status: DC

## 2016-06-07 NOTE — Progress Notes (Signed)
Subjective:    Patient ID: Kim White, female    DOB: April 22, 1985, 31 y.o.   MRN: [id number]  HPI  05/09/77 Patient donates plasma. 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 She is not exactly sure. She denies any fevers or chills or weight loss. She denies any bleeding or bruising. She denies any night sweats. She was also recently diagnosed with tinea pedis and was treated with Lotrimin cream twice daily. She took the cream for 2 weeks without benefit. She has an erythematous rash on the plantar aspects of both feet. There is no discernible plantar wart. There are numerous scaly papules coalescing into a large plaque. The scaly papules are small less than 1 mm in diameter.  At that time, my plan was: Patient has failed topical therapy for 2 weeks. We will try Lamisil 250 mg by mouth daily for 14 days. I am not certain with a dysproteinemia is that the patient was diagnosed with. This was obtained on a screening "SPEP". She only gets this to the fact she donates blood. Therefore I would like to repeat an SPEP so I can see the exact abnormality and determine if this is anything that warrants further workup.  06/07/16 In my opinion the rash on the plantar aspects of both feet is some  better. However there is now a scaly tender macule on the ball of her foot on the right foot. His approximate 1 cm in size. It is scaly and tender to the touch. It may see suspicious for plantar's wart. I performed a KOH of this area.  KOH is negative. I performed a shave debridement of the area. Under the scaly patch of skin, there is only healthy skin and no evidence of a plantar's wart. Therefore I believe the rash on the plantar aspects of both feet may be dyshidrotic eczema since it has not responded to 2 weeks of antifungal cream and 2 weeks of Lamisil Past Medical History:  Diagnosis Date  . Allergy   . Seasonal asthma    Past Surgical  History:  Procedure Laterality Date  . child birth  05/05/2011  . HERNIA REPAIR     Current Outpatient Prescriptions on File Prior to Visit  Medication Sig Dispense Refill  . albuterol (PROAIR HFA) 108 (90 Base) MCG/ACT inhaler Inhale 2 puffs into the lungs every 4 (four) hours as needed for wheezing or shortness of breath (cough). Reported on 05/11/2015    . EPINEPHrine (EPIPEN 2-PAK) 0.3 mg/0.3 mL IJ SOAJ injection Inject 0.3 mg into the muscle once. Reported on 05/11/2015    . levonorgestrel (MIRENA) 20 MCG/24HR IUD 1 each by Intrauterine route once.    . mometasone-formoterol (DULERA) 200-5 MCG/ACT AERO Inhale 2 puffs into the lungs 2 (two) times daily. 1 Inhaler 2  . pramipexole (MIRAPEX) 0.125 MG tablet 1 pill daily x 1 week, then 2 pills each day x 1 week, then 3 pills each day thereafter. Take 90-120 minutes before bedtime, for you: at 9AM. 90 tablet 3   No current facility-administered medications on file prior to visit.    Allergies  Allergen Reactions  . Other Nausea And Vomiting    ALL TREE NUTS  . Shellfish Allergy Swelling   Social History   Social History  . Marital status: Married    Spouse name: N/A  . Number of children: 2  . Years of education: HS   Occupational History  .  Not on file.   Social History Main Topics  . Smoking status: Never Smoker  . Smokeless tobacco: Never Used  . Alcohol use No  . Drug use: No  . Sexual activity: Yes    Birth control/ protection: IUD     Comment: married, 2 boys, works at [employer] hospital   Other Topics Concern  . Not on file   Social History Narrative   Denies caffeine use       Review of Systems  All other systems reviewed and are negative.      Objective:   Physical Exam  Constitutional: She appears well-developed and well-nourished.  Cardiovascular: Normal rate, regular rhythm and normal heart sounds.   Pulmonary/Chest: Effort normal and breath sounds normal. No respiratory distress. She has no wheezes. She  has no rales.  Abdominal: Soft. Bowel sounds are normal.  Lymphadenopathy:    She has no cervical adenopathy.  Skin: Rash noted.  Vitals reviewed.         Assessment & Plan:  Dermatitis NOS KOH is negative. She is still 2 weeks of antifungal cream in 2 weeks of Lamisil. Shave biopsy/debridement of the scaly macule on the plantar aspect of the right foot reveals only healthy tissue beneath it and no evidence of a plantar's wart. 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 Treat with Elocon cream applied once a day for 2 weeks. If no better after 2 weeks, consult dermatology. As I was leaving the room, the patient has been filled out FMLA. When I asked her what 4 she stated for restless leg. She states that sometimes she needs a break from work due to her restless leg. I asked her to be more specific. Apparently the patient has not worked since February. She is missing work everyday because of this. I told the patient that I did not feel that this is medically reasonable or appropriate. She is experiencing that level of pain or fatigue secondary to restless leg syndrome, she is to follow-up with her neurologist. I will complete the FMLA as requested as I believe it is excessive.

## 2016-06-09 ENCOUNTER — Ambulatory Visit: Payer: Managed Care, Other (non HMO) | Admitting: Family Medicine

## 2016-06-09 ENCOUNTER — Other Ambulatory Visit: Payer: Self-pay | Admitting: Family Medicine

## 2016-06-09 MED ORDER — TRIAMCINOLONE ACETONIDE 0.1 % EX OINT: 1.0000 "application " | TOPICAL_OINTMENT | Freq: Every day | CUTANEOUS | 0 refills | Status: DC

## 2016-07-05 ENCOUNTER — Ambulatory Visit: Payer: Managed Care, Other (non HMO) | Admitting: Neurology

## 2016-07-05 ENCOUNTER — Telehealth: Payer: Self-pay

## 2016-07-05 NOTE — Telephone Encounter (Signed)
Pt did not show for their appt with Dr. Athar today.  

## 2016-07-06 ENCOUNTER — Encounter: Payer: Self-pay | Admitting: Neurology

## 2016-07-25 ENCOUNTER — Emergency Department (HOSPITAL_COMMUNITY): Payer: Managed Care, Other (non HMO)

## 2016-07-25 ENCOUNTER — Encounter (HOSPITAL_COMMUNITY): Payer: Self-pay | Admitting: Cardiology

## 2016-07-25 ENCOUNTER — Emergency Department (HOSPITAL_COMMUNITY)
Admission: EM | Admit: 2016-07-25 | Discharge: 2016-07-25 | Disposition: A | Discharge status: Home / Self Care | Payer: Managed Care, Other (non HMO) | Attending: Emergency Medicine | Admitting: Emergency Medicine

## 2016-07-25 DIAGNOSIS — S1081XA Abrasion of other specified part of neck, initial encounter: Secondary | ICD-10-CM | POA: Insufficient documentation

## 2016-07-25 DIAGNOSIS — Y9389 Activity, other specified: Secondary | ICD-10-CM | POA: Insufficient documentation

## 2016-07-25 DIAGNOSIS — Y999 Unspecified external cause status: Secondary | ICD-10-CM | POA: Diagnosis not present

## 2016-07-25 DIAGNOSIS — J45909 Unspecified asthma, uncomplicated: Secondary | ICD-10-CM | POA: Diagnosis not present

## 2016-07-25 DIAGNOSIS — Y929 Unspecified place or not applicable: Secondary | ICD-10-CM | POA: Diagnosis not present

## 2016-07-25 DIAGNOSIS — S199XXA Unspecified injury of neck, initial encounter: Secondary | ICD-10-CM | POA: Diagnosis present

## 2016-07-25 DIAGNOSIS — M542 Cervicalgia: Secondary | ICD-10-CM

## 2016-07-25 DIAGNOSIS — Z79899 Other long term (current) drug therapy: Secondary | ICD-10-CM | POA: Insufficient documentation

## 2016-07-25 MED ORDER — CYCLOBENZAPRINE HCL 5 MG PO TABS: 5.0000 mg | ORAL_TABLET | Freq: Three times a day (TID) | ORAL | 0 refills | Status: DC | PRN

## 2016-07-25 MED ORDER — NAPROXEN 500 MG PO TABS: 500.0000 mg | ORAL_TABLET | Freq: Two times a day (BID) | ORAL | 0 refills | Status: DC
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
## 2016-08-03 ENCOUNTER — Ambulatory Visit (INDEPENDENT_AMBULATORY_CARE_PROVIDER_SITE_OTHER): Payer: Managed Care, Other (non HMO) | Admitting: Neurology

## 2016-08-03 ENCOUNTER — Encounter: Payer: Self-pay | Admitting: Neurology

## 2016-08-03 VITALS — BP 112/64 | HR 68 | Resp 16 | Ht 62.0 in | Wt 220.0 lb

## 2016-08-03 DIAGNOSIS — G4761 Periodic limb movement disorder: Secondary | ICD-10-CM

## 2016-08-03 NOTE — Patient Instructions (Addendum)
We will forego any other medication trials, as you don't have restless legs symptoms and you felt side effects on a trial of Mirapex/pramipexole.   I will see you back as needed.   Please remember to try to maintain good sleep hygiene, which means: Keep a regular sleep and wake schedule, try not to exercise or have a meal within 2 hours of your bedtime, try to keep your bedroom conducive for sleep, that is, cool and dark, without light distractors such as an illuminated alarm clock, and refrain from watching TV right before sleep or in the middle of the night and do not keep the TV or radio on during the night. Also, try not to use or play on electronic devices at bedtime, such as your cell phone, tablet PC or laptop. If you like to read at bedtime on an electronic device, try to dim the background light as much as possible. Do not eat in the middle of the night.   Please do not to drive or work at heights, or operate dangerous or heavy equipment when tired or sleepy.

## 2016-08-03 NOTE — Progress Notes (Signed)
Subjective:    Patient ID: Kim White is a 31 y.o. female.  HPI     Interim history:   Ms. Kim White is a very pleasant 31 year old right-handed woman with an underlying medical history of allergies and seasonal asthma, as well as obesity, who presents for follow-up consultation of her sleep disturbance, including PLMD. The patient is unaccompanied today. Of note, she no showed for an appointment on 07/05/2016. I last saw her on 03/01/2016, at which time we talked about the sleep study results. Her baseline sleep study from 01/21/2016 showed an AHI of 0.5 per hour. Average oxygen saturation was 90%, nadir was 91%. PLMS index was 29.9 per hour without significant arousals. Nevertheless, for her PLMD I suggested a trial of Mirapex with gradual titration. She was having a long commute, living in [place] but working in [place]. She did not have any significant RLS symptoms but leg movements were bothersome to her husband and we talked about her PLMS that were evident on in her sleep study. She was going to bed around 10:30 AM and wakeup time is around 5 PM. She will also take a nap before going to work. She did report a family history of restless leg syndrome.   Today, 08/03/2016: She reports that she stopped taking the Mirapex about a month ago. She noticed side effects including weight gain, nightmares, and it made her feel jittery. She did not call or email in the interim regarding having SEs.  She denies RLS symptoms. Did not have RLS symptoms last time, but I suggested trial of med for PLMs to see if sleep was easier to achieve and more consolidated.  Nevertheless, at this point, she is sleeping a little better. Of note, she handed in her notice. She no longer works at the [employer]. Bedtime is around 10 PM, it may take her an hour to fall asleep, wakeup time for her kids school is 6:15 AM. Of note, she had moderate PLMS without significant arousals during her sleep study. Other than that, she is  reminded that she thankfully did not have any sleep disordered breathing, no significant oxygen desaturations, nadir was 91%, she slept reasonably well with a sleep efficiency of 80.8%, normal REM percentage, sleep latency was 63 minutes. She had some abnormality on her SPEP recently. She donates plasma from time to time and this was noted at the time. She had repeat SPEP with her primary care physician some 3 months ago with very little abnormality noted on the beta-2 band, of by 0.2 g/dl, I explained to her that I doubt that this is significant. She reports that she read that this abnormality can be associated with infection and autoimmune diseases. She is advised to her PCP about this, may be worthwhile to repeat it at some point but I tried to reassure her in that regard.   The patient's allergies, current medications, family history, past medical history, past social history, past surgical history and problem list were reviewed and updated as appropriate.   Previously (copied from previous notes for reference):   I first met her on 09/23/2015 at the request of her primary care physician, at which time she reported involuntary limb movements while asleep. I invited her for a sleep study, she had a baseline sleep study on 01/21/2016. I went over her test results with her in detail today. Sleep efficiency was 80.8% with a sleep latency of 63 minutes which is prolonged, REM latency was high normal at 119.5 minutes. She had an increased  percentage of stage II sleep, a decreased percentage of slow-wave sleep and normal amount of REM sleep at 24.3%. She had mild intermittent snoring. She had a total AHI of 0.5 per hour, REM AHI of 1.2 per hour, average oxygen saturation was 98%, nadir was 91%. She had mild PLMS with an index of 29.9 per hour, with an associated arousal index of 0 per hour.   09/23/2015: She reports involuntary limb movements while asleep. She is reported to twitch in her sleep by her husband.  He describes jerking of her body or flailing movements of her arms and legs, sometimes associated with a gasping sound or pauses in her breathing while asleep. She does not endorse any gasping sensation while asleep. She does snore loudly per husband. She denies any nocturia. She denies restless leg symptoms. She is not aware of any family history of OSA or restless leg syndrome. She denies any involuntary movements during wakefulness or prior to falling asleep. She has been working third shift for the past 6 months. 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 She has a three-hour commute to work. She works from 11:30 PM to 8 AM. She gets home around 11 AM and goes to bed usually around noon. During her nights off she sleeps at night. She does not report sleepiness at the wheel. She has been married for 6 years. Her husband reports nearly nightly twitching. She has a family history of epilepsy (uncle and first cousin), but no personal history of seizures or epilepsy. Her mother has basilar migraines, she herself does not suffer from migraine headaches. She has frequent morning headaches though, these are transient and dull and achy, global. She does not take medication for these. I reviewed your office note from 08/28/2015. She lives at home with her husband and 2 boys. She does not smoke or drink alcohol or drink caffeine, she does not use illicit drugs. She denies sleep attacks but is sleepy when sedentary. Denies episodes of weakness or cataplexy and denies episodes of hallucinations or dream sequences.  Her Past Medical History Is Significant For: Past Medical History:  Diagnosis Date  . Allergy   . Seasonal asthma     Her Past Surgical History Is Significant For: Past Surgical History:  Procedure Laterality Date  . child birth  05/05/2011  . HERNIA REPAIR      Her Family History Is Significant For: Family History  Problem Relation Age of Onset  . Asthma Brother   . Hyperlipidemia  Maternal Grandmother   . Cancer Maternal Grandmother        Breast  . Arthritis Mother   . Hearing loss Mother   . Miscarriages / [place] Mother   . Hyperlipidemia Maternal Grandfather   . Heart disease Maternal Grandfather        in his 91's  . Alcohol abuse Paternal Grandmother   . Stroke Father        suspected from substance  . Hypertension Father   . Cancer Other   . Allergic rhinitis Neg Hx   . Eczema Neg Hx   . Immunodeficiency Neg Hx   . Urticaria Neg Hx     Her Social History Is Significant For: Social History   Social History  . Marital status: Legally Separated    Spouse name: N/A  . Number of children: 2  . Years of education: HS   Social History Main Topics  . Smoking status: Never Smoker  . Smokeless tobacco: Never  Used  . Alcohol use No  . Drug use: No  . Sexual activity: Yes    Birth control/ protection: IUD     Comment: married, 2 boys, works at [employer]   Other Topics Concern  . None   Social History Narrative   Denies caffeine use     Her Allergies Are:  Allergies  Allergen Reactions  . Other Nausea And Vomiting    ALL TREE NUTS  . Shellfish Allergy Swelling  :   Her Current Medications Are:  Outpatient Encounter Prescriptions as of 08/03/2016  Medication Sig  . albuterol (PROAIR HFA) 108 (90 Base) MCG/ACT inhaler Inhale 2 puffs into the lungs every 4 (four) hours as needed for wheezing or shortness of breath (cough). Reported on 05/11/2015  . EPINEPHrine (EPIPEN 2-PAK) 0.3 mg/0.3 mL IJ SOAJ injection Inject 0.3 mg into the muscle once. Reported on 05/11/2015  . levonorgestrel (MIRENA) 20 MCG/24HR IUD 1 each by Intrauterine route once.  . mometasone-formoterol (DULERA) 200-5 MCG/ACT AERO Inhale 2 puffs into the lungs 2 (two) times daily.  Marland Kitchen triamcinolone ointment (KENALOG) 0.1 % Apply 1 application topically daily.  . [DISCONTINUED] pramipexole (MIRAPEX) 0.125 MG tablet 1 pill daily x 1 week, then 2 pills each day x 1 week, then 3  pills each day thereafter. Take 90-120 minutes before bedtime, for you: at 9AM.  . [DISCONTINUED] cyclobenzaprine (FLEXERIL) 5 MG tablet Take 1 tablet (5 mg total) by mouth 3 (three) times daily as needed for muscle spasms.  . [DISCONTINUED] naproxen (NAPROSYN) 500 MG tablet Take 1 tablet (500 mg total) by mouth 2 (two) times daily.   No facility-administered encounter medications on file as of 08/03/2016.   :  Review of Systems:  Out of a complete 14 point review of systems, all are reviewed and negative with the exception of these symptoms as listed below: Review of Systems  Neurological:       Patient states that she stopped the Mirapex about 4 weeks ago. Reports that it gave her nightmares, made her feel jittery, and caused her to gain weight.     Objective:  Neurologic Exam  Physical Exam Physical Examination:   Vitals:   08/03/16 0919  BP: 112/64  Pulse: 68  Resp: 16    General Examination: The patient is a very pleasant 31 y.o. female in no acute distress. She appears well-developed and well-nourished and well groomed.   HEENT: Normocephalic, atraumatic, pupils are equal, round and reactive to light and accommodation. Extraocular tracking is good without limitation to gaze excursion or nystagmus noted. Normal smooth pursuit is noted. Hearing is grossly intact. Eye glasses in place. Face is symmetric with normal facial animation and normal facial sensation. Speech is clear with no dysarthria noted. There is no hypophonia. There is no lip, neck/head, jaw or voice tremor. Neck is supple with full range of passive and active motion. There are no carotid bruits on auscultation. Oropharynx exam reveals: mild mouth dryness, good dental hygiene and mild airway crowding. Mallampati is class I. She has a mild overbite. Tongue protrudes centrally, palate elevates symmetrically, no pharyngeal erythema noted.  Chest: Clear to auscultation without wheezing, rhonchi or crackles  noted.  Heart: S1+S2+0, regular and normal without murmurs, rubs or gallops noted.   Abdomen: Soft, non-tender and non-distended with normal bowel sounds appreciated on auscultation.  Extremities: There is no pitting edema in the distal lower extremities bilaterally. Pedal pulses are intact.  Skin: Warm and dry without trophic changes noted. There are  no varicose veins.  Musculoskeletal: exam reveals no obvious joint deformities, tenderness or joint swelling or erythema.   Neurologically:  Mental status: The patient is awake, alert and oriented in all 4 spheres. Her immediate and remote memory, attention, language skills and fund of knowledge are appropriate. There is no evidence of aphasia, agnosia, apraxia or anomia. Speech is clear with normal prosody and enunciation. Thought process is linear. Mood is normal and affect is normal.  Cranial nerves II - XII are as described above under HEENT exam.  Motor exam: Normal bulk, strength and tone is noted. There is no resting or action or postural tremor, Romberg is negative. Reflexes are 2+ throughout. Fine motor skills and coordination: intact with normal finger taps, normal hand movements, normal rapid alternating patting, normal foot taps and normal foot agility.  Cerebellar testing: No dysmetria or intention tremor on finger to nose testing. Heel to shin is unremarkable bilaterally. There is no truncal or gait ataxia.  Sensory exam: intact to light touch in the upper and lower extremities.  Gait, station and balance: She stands easily. No veering to one side is noted. No leaning to one side is noted. Posture is age-appropriate and stance is narrow based. Gait shows normal stride length and normal pace. No problems turning are noted. Tandem walk is unremarkable.  Assessment and Plan:  In summary, Kim White is a very pleasant 31 year old female with an underlying medical history of allergies and seasonal asthma, as well as obesity,  who presents for follow-up consultation, after a trial of Mirapex for PLMD. Her sleep study was negative for OSA. She had difficulty with sleep schedule in the context of a long commute and also shift work. She has since then quit her job at the [employer]. She is sleeping better, she continues to deny any restless leg symptoms. Her PLMS were in the moderate range but not associated with any significant arousals and I had suggested symptomatic treatment see if we could help consolidate her sleep and achieve sleep a little easier at night. She is trying to keep a better schedule at this time as she is not working at this moment. She is overall doing a little better with her sleep-related issues. At this juncture, I suggested as needed follow-up and recommended to forego any medication trials for PLMS in particular she is not bothered by PLMS or restless leg symptoms.  I again reminded her to allow enough sleep time and maintaining good sleep hygiene.  I answered all her questions today and she was in agreement. I spent 20 minutes in total face-to-face time with the patient, more than 50% of which was spent in counseling and coordination of care, reviewing test results, reviewing medication and discussing or reviewing the diagnosis of PLMD, its prognosis and treatment options. Pertinent laboratory and imaging test results that were available during this visit with the patient were reviewed by me and considered in my medical decision making (see chart for details).

## 2016-08-25 ENCOUNTER — Emergency Department (HOSPITAL_COMMUNITY)
Admission: EM | Admit: 2016-08-25 | Discharge: 2016-08-26 | Disposition: A | Discharge status: Home / Self Care | Payer: Managed Care, Other (non HMO) | Attending: Emergency Medicine | Admitting: Emergency Medicine

## 2016-08-25 ENCOUNTER — Encounter (HOSPITAL_COMMUNITY): Payer: Self-pay | Admitting: Emergency Medicine

## 2016-08-25 DIAGNOSIS — Z79899 Other long term (current) drug therapy: Secondary | ICD-10-CM | POA: Diagnosis not present

## 2016-08-25 DIAGNOSIS — R1030 Lower abdominal pain, unspecified: Secondary | ICD-10-CM | POA: Insufficient documentation

## 2016-08-25 DIAGNOSIS — J45909 Unspecified asthma, uncomplicated: Secondary | ICD-10-CM | POA: Insufficient documentation

## 2016-08-25 DIAGNOSIS — R109 Unspecified abdominal pain: Secondary | ICD-10-CM

## 2016-08-25 LAB — URINALYSIS, ROUTINE W REFLEX MICROSCOPIC
BILIRUBIN URINE: NEGATIVE
Bilirubin Urine: NEGATIVE
Glucose, UA: NEGATIVE mg/dL
Glucose, UA: NEGATIVE mg/dL
Hgb urine dipstick: NEGATIVE
Hgb urine dipstick: NEGATIVE
KETONES UR: NEGATIVE mg/dL
KETONES UR: NEGATIVE mg/dL
Nitrite: NEGATIVE
Nitrite: NEGATIVE
PROTEIN: NEGATIVE mg/dL
PROTEIN: NEGATIVE mg/dL
SPECIFIC GRAVITY, URINE: 1.02 (ref 1.005–1.030)
Specific Gravity, Urine: 1.02 (ref 1.005–1.030)
pH: 5 (ref 5.0–8.0)
pH: 5 (ref 5.0–8.0)

## 2016-08-25 LAB — CBC WITH DIFFERENTIAL/PLATELET
BASOS PCT: 0 %
Basophils Absolute: 0 10*3/uL (ref 0.0–0.1)
EOS ABS: 0.3 10*3/uL (ref 0.0–0.7)
Eosinophils Relative: 5 %
HCT: 43.4 % (ref 36.0–46.0)
HEMOGLOBIN: 14.5 g/dL (ref 12.0–15.0)
Lymphocytes Relative: 34 %
Lymphs Abs: 2.1 10*3/uL (ref 0.7–4.0)
MCH: 27.2 pg (ref 26.0–34.0)
MCHC: 33.4 g/dL (ref 30.0–36.0)
MCV: 81.3 fL (ref 78.0–100.0)
MONOS PCT: 6 %
Monocytes Absolute: 0.4 10*3/uL (ref 0.1–1.0)
NEUTROS PCT: 55 %
Neutro Abs: 3.4 10*3/uL (ref 1.7–7.7)
Platelets: 191 10*3/uL (ref 150–400)
RBC: 5.34 MIL/uL — AB (ref 3.87–5.11)
RDW: 12.9 % (ref 11.5–15.5)
WBC: 6.2 10*3/uL (ref 4.0–10.5)

## 2016-08-25 LAB — COMPREHENSIVE METABOLIC PANEL
ALBUMIN: 2.8 g/dL — AB (ref 3.5–5.0)
ALK PHOS: 43 U/L (ref 38–126)
ALT: 32 U/L (ref 14–54)
ANION GAP: 7 (ref 5–15)
AST: 39 U/L (ref 15–41)
BUN: 9 mg/dL (ref 6–20)
CALCIUM: 8.2 mg/dL — AB (ref 8.9–10.3)
CO2: 24 mmol/L (ref 22–32)
Chloride: 105 mmol/L (ref 101–111)
Creatinine, Ser: 0.87 mg/dL (ref 0.44–1.00)
GFR calc Af Amer: >60 mL/min (ref >60)
GFR calc non Af Amer: >60 mL/min (ref >60)
GLUCOSE: 101 mg/dL — AB (ref 65–99)
Potassium: 3.9 mmol/L (ref 3.5–5.1)
SODIUM: 136 mmol/L (ref 135–145)
Total Bilirubin: 0.4 mg/dL (ref 0.3–1.2)
Total Protein: 6 g/dL — ABNORMAL LOW (ref 6.5–8.1)

## 2016-08-25 LAB — PREGNANCY, URINE: Preg Test, Ur: NEGATIVE

## 2016-08-25 MED ORDER — CEPHALEXIN 500 MG PO CAPS: 500.0000 mg | ORAL_CAPSULE | Freq: Three times a day (TID) | ORAL | 0 refills | Status: DC

## 2016-08-25 NOTE — ED Triage Notes (Signed)
PT states she has been having right flank pain since Monday and her urine is cloudy

## 2016-08-25 NOTE — Discharge Instructions (Signed)
Your urine still looks mildly contaminated. Since you are having some urinary symptoms, will treat for possible mild kidney infection. This may also just be muscle related strain. Continue ibuprofen and tylenol for pain Return for worsening symptoms, including fever, escalating pain, intractable vomiting or any other symptoms concerning to you.

## 2016-08-25 NOTE — ED Provider Notes (Signed)
[facility]-EMERGENCY DEPT Provider Note   CSN: [id number] Arrival date & time: 08/25/16  2049     History   Chief Complaint Chief Complaint  Patient presents with  . Flank Pain    HPI Kim White is a 31 y.o. female.  The history is provided by the patient.  Flank Pain  This is a new problem. The current episode started more than 2 days ago. The problem occurs constantly. The problem has not changed since onset.Pertinent negatives include no abdominal pain and no shortness of breath. The symptoms are aggravated by bending. Nothing relieves the symptoms. She has tried ASA for the symptoms. The treatment provided mild relief.     31 year old female who presents with right flank pain onset 3-4 days ago. Pain constant, worse with palpation and movement. Minimally improved with ibuprofen. No n/v/d, fever, chills, vaginal discharge, dysuria or frequency. Has noticed cloudy urine. No abdominal pain. No heavy lifting or injury.  Past Medical History:  Diagnosis Date  . Allergy   . Seasonal asthma     Patient Active Problem List   Diagnosis Date Noted  . Moderate persistent asthma 04/29/2015  . 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 Perennial and seasonal allergic rhinoconjunctivitis 04/29/2015    Past Surgical History:  Procedure Laterality Date  . child birth  05/05/2011  . HERNIA REPAIR      OB History    Gravida Para Term Preterm AB Living   1 1 1     2    SAB TAB Ectopic Multiple Live Births                   Home Medications    Prior to Admission medications   Medication Sig Start Date End Date Taking? Authorizing Provider  albuterol (PROAIR HFA) 108 (90 Base) MCG/ACT inhaler Inhale 2 puffs into the lungs every 4 (four) hours as needed for wheezing or shortness of breath (cough). Reported on 05/11/2015   Yes [provider]  EPINEPHrine (EPIPEN 2-PAK) 0.3 mg/0.3 mL IJ SOAJ injection Inject 0.3 mg into the muscle once. Reported on 05/11/2015   Yes  [provider]  levonorgestrel (MIRENA) 20 MCG/24HR IUD 1 each by Intrauterine route once.   Yes [provider]  mometasone-formoterol (DULERA) 200-5 MCG/ACT AERO Inhale 2 puffs into the lungs 2 (two) times daily. 05/11/15  Yes Bobbitt, Heywood Iles, MD  cephALEXin (KEFLEX) 500 MG capsule Take 1 capsule (500 mg total) by mouth 3 (three) times daily. 08/25/16   Lavera Guise, MD    Family History Family History  Problem Relation Age of Onset  . Asthma Brother   . Hyperlipidemia Maternal Grandmother   . Cancer Maternal Grandmother        Breast  . Arthritis Mother   . Hearing loss Mother   . Miscarriages / [place] Mother   . Hyperlipidemia Maternal Grandfather   . Heart disease Maternal Grandfather        in his 39's  . Alcohol abuse Paternal Grandmother   . Stroke Father        suspected from substance  . Hypertension Father   . Cancer Other   . Allergic rhinitis Neg Hx   . Eczema Neg Hx   . Immunodeficiency Neg Hx   . Urticaria Neg Hx     Social History Social History  Substance Use Topics  . Smoking status: Never Smoker  . Smokeless tobacco: Never Used  . Alcohol use No  Allergies   Other and Shellfish allergy   Review of Systems Review of Systems  Constitutional: Negative for fever.  Respiratory: Negative for shortness of breath.   Gastrointestinal: Negative for abdominal pain.  Genitourinary: Positive for flank pain. Negative for dysuria and frequency.  Musculoskeletal: Negative for back pain.  Allergic/Immunologic: Negative for immunocompromised state.  Psychiatric/Behavioral: Negative for confusion.  All other systems reviewed and are negative.    Physical Exam Updated Vital Signs BP 121/71 (BP Location: Left Arm)   Pulse 66   Temp 98.9 F (37.2 C) (Oral)   Resp 19   Ht 5\' 2"  (1.575 m)   Wt 99.3 kg (219 lb)   SpO2 100%   BMI 40.06 kg/m   Physical Exam Physical Exam  Nursing note and vitals reviewed. Constitutional:  Well developed, well nourished, non-toxic, and in no acute distress Head: Normocephalic and atraumatic.  Mouth/Throat: Oropharynx is clear and moist.  Neck: Normal range of motion. Neck supple.  Cardiovascular: Normal rate and regular rhythm.   Pulmonary/Chest: Effort normal and breath sounds normal.  Abdominal: Soft. There is no tenderness. There is no rebound and no guarding. mild right CVA tenderness and tenderness to palpation of right flank muscles Musculoskeletal: Normal range of motion.  Neurological: Alert, no facial droop, fluent speech, moves all extremities symmetrically Skin: Skin is warm and dry.  Psychiatric: Cooperative   ED Treatments / Results  Labs (all labs ordered are listed, but only abnormal results are displayed) Labs Reviewed  URINALYSIS, ROUTINE W REFLEX MICROSCOPIC - Abnormal; Notable for the following:       Result Value   APPearance CLOUDY (*)    Leukocytes, UA LARGE (*)    Bacteria, UA FEW (*)    Squamous Epithelial / LPF TOO NUMEROUS TO COUNT (*)    All other components within normal limits  CBC WITH DIFFERENTIAL/PLATELET - Abnormal; Notable for the following:    RBC 5.34 (*)    All other components within normal limits  COMPREHENSIVE METABOLIC PANEL - Abnormal; Notable for the following:    Glucose, Bld 101 (*)    Calcium 8.2 (*)    Total Protein 6.0 (*)    Albumin 2.8 (*)    All other components within normal limits  URINALYSIS, ROUTINE W REFLEX MICROSCOPIC - Abnormal; Notable for the following:    APPearance CLOUDY (*)    Leukocytes, UA MODERATE (*)    Bacteria, UA RARE (*)    Squamous Epithelial / LPF 6-30 (*)    All other components within normal limits  URINE CULTURE  PREGNANCY, URINE    EKG  EKG Interpretation None       Radiology No results found.  Procedures Procedures (including critical care time)  Medications Ordered in ED Medications - No data to display   Initial Impression / Assessment and Plan / ED Course  I  have reviewed the triage vital signs and the nursing notes.  Pertinent labs & imaging results that were available during my care of the patient were reviewed by me and considered in my medical decision making (see chart for details).     Presenting with right flank pain for 3 days with malodorous and cloudy urine. She is nontoxic in no acute distress with normal vital signs. Soft and benign abdomen without significant tenderness. Minimal right CVA tenderness, but also tenderness with palpation of the paraspinal muscles. UA recollected as many squamous cells. Repeat urine with still some squamous cells, leukocytes, and bacteria. Will treat with keflex given  some mild urinary symptoms and could be mild pyelonephritis. MSK pain also possible. Strict return and follow-up instructions reviewed. She expressed understanding of all discharge instructions and felt comfortable with the plan of care.   Final Clinical Impressions(s) / ED Diagnoses   Final diagnoses:  Flank pain    New Prescriptions New Prescriptions   CEPHALEXIN (KEFLEX) 500 MG CAPSULE    Take 1 capsule (500 mg total) by mouth 3 (three) times daily.     Lavera GuiseLiu, Evangelos Paulino Duo, MD 08/25/16 [phone]

## 2016-08-27 LAB — URINE CULTURE

## 2016-11-23 ENCOUNTER — Encounter: Payer: Self-pay | Admitting: Family Medicine

## 2016-12-22 ENCOUNTER — Encounter: Payer: Self-pay | Admitting: Family Medicine

## 2017-01-19 ENCOUNTER — Encounter: Payer: Self-pay | Admitting: Family Medicine
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
## 2017-06-02 ENCOUNTER — Ambulatory Visit (INDEPENDENT_AMBULATORY_CARE_PROVIDER_SITE_OTHER): Payer: Medicaid Other | Admitting: Certified Nurse Midwife

## 2017-06-02 ENCOUNTER — Encounter: Payer: Self-pay | Admitting: Certified Nurse Midwife

## 2017-06-02 ENCOUNTER — Other Ambulatory Visit (HOSPITAL_COMMUNITY)
Admission: RE | Admit: 2017-06-02 | Discharge: 2017-06-02 | Disposition: A | Discharge status: Home / Self Care | Payer: Medicaid Other | Source: Ambulatory Visit | Attending: Family Medicine | Admitting: Family Medicine

## 2017-06-02 VITALS — BP 133/85 | HR 52 | Wt 229.6 lb

## 2017-06-02 DIAGNOSIS — Z309 Encounter for contraceptive management, unspecified: Secondary | ICD-10-CM

## 2017-06-02 DIAGNOSIS — N760 Acute vaginitis: Secondary | ICD-10-CM

## 2017-06-02 DIAGNOSIS — Z30432 Encounter for removal of intrauterine contraceptive device: Secondary | ICD-10-CM | POA: Insufficient documentation

## 2017-06-02 DIAGNOSIS — B373 Candidiasis of vulva and vagina: Secondary | ICD-10-CM

## 2017-06-02 DIAGNOSIS — Z01419 Encounter for gynecological examination (general) (routine) without abnormal findings: Secondary | ICD-10-CM

## 2017-06-02 DIAGNOSIS — B3731 Acute candidiasis of vulva and vagina: Secondary | ICD-10-CM

## 2017-06-02 DIAGNOSIS — B9689 Other specified bacterial agents as the cause of diseases classified elsewhere: Secondary | ICD-10-CM

## 2017-06-02 MED ORDER — TERCONAZOLE 0.8 % VA CREA: 1.0000 | TOPICAL_CREAM | Freq: Every day | VAGINAL | 2 refills | Status: DC

## 2017-06-02 MED ORDER — METRONIDAZOLE 0.75 % VA GEL: 1.0000 | VAGINAL | 4 refills | Status: DC

## 2017-06-02 MED ORDER — FLUCONAZOLE 200 MG PO TABS
200.0000 mg | ORAL_TABLET | Freq: Once | ORAL | 2 refills | Status: AC
Start: 2017-06-02 — End: 2017-06-02

## 2017-06-02 NOTE — Progress Notes (Signed)
Subjective:        Kim White is a 32 y.o. female here for a routine exam.  Current complaints: none.  Mirena IUD due to come out June 2018.  Has yeast infections frequently.  Irregular periods, denies any cramping, clots or heavy bleeding.  2 years ago was last exam.  Currently sexually active, married. Declines STD screening.  Desires IUD removal today.  Contraception options discussed.    Personal health questionnaire:  Is patient Ashkenazi Jewish, have a family history of breast and/or ovarian cancer: yes Is there a family history of uterine cancer diagnosed at age < 2350, gastrointestinal cancer, urinary tract cancer, family member who is a Personnel officerLynch syndrome-associated carrier: no Is the patient overweight and hypertensive, family history of diabetes, personal history of gestational diabetes, preeclampsia or PCOS: yes Is patient over 6255, have PCOS,  family history of premature CHD under age 30, diabetes, smoke, have hypertension or peripheral artery disease:  yes At any time, has a partner hit, kicked or otherwise hurt or frightened you?: no Over the past 2 weeks, have you felt down, depressed or hopeless?: no Over the past 2 weeks, have you felt little interest or pleasure in doing things?:no   Gynecologic History No LMP recorded. (Menstrual status: IUD). Contraception: IUD Last Pap: 06/18/15. Results were: normal Last mammogram: n/a <40, no significant family history.   Obstetric History OB History  Gravida Para Term Preterm AB Living  1 1 1     1   SAB TAB Ectopic Multiple Live Births          1    # Outcome Date GA Lbr Len/2nd Weight Sex Delivery Anes PTL Lv  1 Term             Past Medical History:  Diagnosis Date  . Allergy   . Seasonal asthma     Past Surgical History:  Procedure Laterality Date  . child birth  05/05/2011  . HERNIA REPAIR       Current Outpatient Medications:  .  albuterol (PROAIR HFA) 108 (90 Base) MCG/ACT inhaler, Inhale 2 puffs into the  lungs every 4 (four) hours as needed for wheezing or shortness of breath (cough). Reported on 05/11/2015, Disp: , Rfl:  .  EPINEPHrine (EPIPEN 2-PAK) 0.3 mg/0.3 mL IJ SOAJ injection, Inject 0.3 mg into the muscle once. Reported on 05/11/2015, Disp: , Rfl:  .  levonorgestrel (MIRENA) 20 MCG/24HR IUD, 1 each by Intrauterine route once., Disp: , Rfl:  .  mometasone-formoterol (DULERA) 200-5 MCG/ACT AERO, Inhale 2 puffs into the lungs 2 (two) times daily., Disp: 1 Inhaler, Rfl: 2 .  fluconazole (DIFLUCAN) 200 MG tablet, Take 1 tablet (200 mg total) by mouth once for 1 dose. Repeat dose in 48-72 hours., Disp: 3 tablet, Rfl: 2 .  [START ON 06/05/2017] metroNIDAZOLE (METROGEL VAGINAL) 0.75 % vaginal gel, Place 1 Applicatorful vaginally 2 (two) times a week. For 4-6 months., Disp: 70 g, Rfl: 4 .  terconazole (TERAZOL 3) 0.8 % vaginal cream, Place 1 applicator vaginally at bedtime., Disp: 20 g, Rfl: 2 Allergies  Allergen Reactions  . Other Nausea And Vomiting    ALL TREE NUTS  . Shellfish Allergy Swelling    Social History   Tobacco Use  . Smoking status: Never Smoker  . Smokeless tobacco: Never Used  Substance Use Topics  . Alcohol use: No    Alcohol/week: 0.0 oz    Family History  Problem Relation Age of Onset  . Asthma Brother   .  Hyperlipidemia Maternal Grandmother   . Cancer Maternal Grandmother        Breast  . Arthritis Mother   . Hearing loss Mother   . Miscarriages / [place] Mother   . Hyperlipidemia Maternal Grandfather   . Heart disease Maternal Grandfather        in his 53's  . Alcohol abuse Paternal Grandmother   . Stroke Father        suspected from substance  . Hypertension Father   . Cancer Other   . Allergic rhinitis Neg Hx   . Eczema Neg Hx   . Immunodeficiency Neg Hx   . Urticaria Neg Hx       Review of Systems  Constitutional: negative for fatigue and weight loss Respiratory: negative for cough and wheezing Cardiovascular: negative for chest pain, fatigue  and palpitations Gastrointestinal: negative for abdominal pain and change in bowel habits Musculoskeletal:negative for myalgias Neurological: negative for gait problems and tremors Behavioral/Psych: negative for abusive relationship, depression Endocrine: negative for temperature intolerance    Genitourinary:negative for abnormal menstrual periods, genital lesions, hot flashes, sexual problems and vaginal discharge Integument/breast: negative for breast lump, breast tenderness, nipple discharge and skin lesion(s)    Objective:       BP 133/85   Pulse (!) 52   Wt 229 lb 9.6 oz (104.1 kg)   BMI 41.99 kg/m  General:   alert  Skin:   no rash or abnormalities  Lungs:   clear to auscultation bilaterally  Heart:   regular rate and rhythm, S1, S2 normal, no murmur, click, rub or gallop  Breasts:   normal without suspicious masses, skin or nipple changes or axillary nodes  Abdomen:  normal findings: no organomegaly, soft, non-tender and no hernia  Pelvis:  External genitalia: normal general appearance Urinary system: urethral meatus normal and bladder without fullness, nontender Vaginal: normal without tenderness, induration or masses Cervix: normal appearance Adnexa: normal bimanual exam Uterus: anteverted and non-tender, normal size      GYNECOLOGY OFFICE PROCEDURE NOTE  Kim White is a 32 y.o. G1P1001 here for Liletta IUD removal. No GYN concerns.  Last pap smear was on 06/2017 and was normal.  IUD Removal  Patient identified, informed consent performed, consent signed.  Patient was in the dorsal lithotomy position, normal external genitalia was noted.  A speculum was placed in the patient's vagina, normal discharge was noted, no lesions. The cervix was visualized, no lesions, no abnormal discharge.  The strings of the IUD were grasped and pulled using ring forceps. The IUD was removed in its entirety.  Patient tolerated the procedure well.    Patient will use abstinence for  contraception. She was told to avoid teratogens, take PNV and folic acid.  Routine preventative health maintenance measures emphasized.   [facility] [employer], [facility]   Lab Review Urine pregnancy test Labs reviewed yes Radiologic studies reviewed no  50% of 30 min visit spent on counseling and coordination of care.   Assessment & Plan    Healthy female exam.    1. Well woman exam     2. Encounter for IUD removal     - Cytology - PAP  3. Yeast vaginitis    - fluconazole (DIFLUCAN) 200 MG tablet; Take 1 tablet (200 mg total) by mouth once for 1 dose. Repeat dose in 48-72 hours.  Dispense: 3 tablet; Refill: 2 - terconazole (TERAZOL 3) 0.8 % vaginal cream; Place 1 applicator vaginally at bedtime.  Dispense: 20 g; Refill: 2  4. 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 For 4-6 months.  Dispense: 70 g; Refill: 4   Education reviewed: calcium supplements, depression evaluation, low fat, low cholesterol diet, safe sex/STD prevention, self breast exams, skin cancer screening and weight bearing exercise. Contraception: abstinence. Follow up in: 1 year.   PNV samples given with coupons  Meds ordered this encounter  Medications  . metroNIDAZOLE (METROGEL VAGINAL) 0.75 % vaginal gel    Sig: Place 1 Applicatorful vaginally 2 (two) times a week. For 4-6 months.    Dispense:  70 g    Refill:  4  . fluconazole (DIFLUCAN) 200 MG tablet    Sig: Take 1 tablet (200 mg total) by mouth once for 1 dose. Repeat dose in 48-72 hours.    Dispense:  3 tablet    Refill:  2  . terconazole (TERAZOL 3) 0.8 % vaginal cream    Sig: Place 1 applicator vaginally at bedtime.    Dispense:  20 g    Refill:  2    Possible management options include: LARK, patch, ring, OCPs, etc.  Follow up as needed for contraception.

## 2017-06-06 LAB — CYTOLOGY - PAP: DIAGNOSIS: NEGATIVE

## 2017-06-13 ENCOUNTER — Other Ambulatory Visit: Payer: Self-pay | Admitting: Certified Nurse Midwife

## 2017-10-20 ENCOUNTER — Ambulatory Visit (INDEPENDENT_AMBULATORY_CARE_PROVIDER_SITE_OTHER): Payer: Self-pay

## 2017-10-20 ENCOUNTER — Encounter: Payer: Self-pay | Admitting: Advanced Practice Midwife

## 2017-10-20 VITALS — BP 128/82 | HR 71 | Wt 225.0 lb

## 2017-10-20 DIAGNOSIS — N912 Amenorrhea, unspecified: Secondary | ICD-10-CM

## 2017-10-20 DIAGNOSIS — Z3201 Encounter for pregnancy test, result positive: Secondary | ICD-10-CM

## 2017-10-20 LAB — POCT URINE PREGNANCY: PREG TEST UR: POSITIVE — AB

## 2017-10-20 NOTE — Progress Notes (Signed)
RGYN patient presents for IUD Insertion IUD was removed last visit  on 06/02/2017 was 6 yrs past due to be removed per pt.   LMP: June per pt not sure of date.   Pt denies any unprotected intercourse x 14 days.   UPT:  Positive   Pt now admits to + UPT 2 weeks ago. Pt made aware of results And advised to schedule NOB appt. Est GA is about 6-7 weeks. Pt cannot confirm LMP.

## 2017-11-10 ENCOUNTER — Emergency Department (HOSPITAL_COMMUNITY)
Admission: EM | Admit: 2017-11-10 | Discharge: 2017-11-10 | Disposition: A | Discharge status: Home / Self Care | Payer: Medicaid Other | Attending: Emergency Medicine | Admitting: Emergency Medicine

## 2017-11-10 ENCOUNTER — Emergency Department (HOSPITAL_COMMUNITY): Payer: Medicaid Other

## 2017-11-10 ENCOUNTER — Encounter (HOSPITAL_COMMUNITY): Payer: Self-pay | Admitting: Emergency Medicine

## 2017-11-10 ENCOUNTER — Other Ambulatory Visit: Payer: Self-pay

## 2017-11-10 DIAGNOSIS — N939 Abnormal uterine and vaginal bleeding, unspecified: Secondary | ICD-10-CM

## 2017-11-10 DIAGNOSIS — O209 Hemorrhage in early pregnancy, unspecified: Secondary | ICD-10-CM | POA: Insufficient documentation

## 2017-11-10 DIAGNOSIS — O99511 Diseases of the respiratory system complicating pregnancy, first trimester: Secondary | ICD-10-CM | POA: Insufficient documentation

## 2017-11-10 DIAGNOSIS — Z3A13 13 weeks gestation of pregnancy: Secondary | ICD-10-CM | POA: Insufficient documentation

## 2017-11-10 DIAGNOSIS — J45909 Unspecified asthma, uncomplicated: Secondary | ICD-10-CM | POA: Insufficient documentation

## 2017-11-10 LAB — COMPREHENSIVE METABOLIC PANEL
ALBUMIN: 3.2 g/dL — AB (ref 3.5–5.0)
ALK PHOS: 34 U/L — AB (ref 38–126)
ALT: 13 U/L (ref 0–44)
ANION GAP: 8 (ref 5–15)
AST: 16 U/L (ref 15–41)
BILIRUBIN TOTAL: 0.4 mg/dL (ref 0.3–1.2)
BUN: 8 mg/dL (ref 6–20)
CALCIUM: 9.3 mg/dL (ref 8.9–10.3)
CO2: 23 mmol/L (ref 22–32)
Chloride: 105 mmol/L (ref 98–111)
Creatinine, Ser: 0.64 mg/dL (ref 0.44–1.00)
GLUCOSE: 90 mg/dL (ref 70–99)
Potassium: 3.4 mmol/L — ABNORMAL LOW (ref 3.5–5.1)
Sodium: 136 mmol/L (ref 135–145)
TOTAL PROTEIN: 7.7 g/dL (ref 6.5–8.1)

## 2017-11-10 LAB — CBC WITH DIFFERENTIAL/PLATELET
Basophils Absolute: 0 10*3/uL (ref 0.0–0.1)
Basophils Relative: 0 %
Eosinophils Absolute: 0.1 10*3/uL (ref 0.0–0.7)
Eosinophils Relative: 3 %
HEMATOCRIT: 37.8 % (ref 36.0–46.0)
HEMOGLOBIN: 12.9 g/dL (ref 12.0–15.0)
LYMPHS PCT: 28 %
Lymphs Abs: 1.4 10*3/uL (ref 0.7–4.0)
MCH: 27.6 pg (ref 26.0–34.0)
MCHC: 34.1 g/dL (ref 30.0–36.0)
MCV: 80.9 fL (ref 78.0–100.0)
MONOS PCT: 7 %
Monocytes Absolute: 0.4 10*3/uL (ref 0.1–1.0)
NEUTROS ABS: 3.3 10*3/uL (ref 1.7–7.7)
NEUTROS PCT: 62 %
Platelets: 196 10*3/uL (ref 150–400)
RBC: 4.67 MIL/uL (ref 3.87–5.11)
RDW: 12.5 % (ref 11.5–15.5)
WBC: 5.2 10*3/uL (ref 4.0–10.5)

## 2017-11-10 LAB — HCG, QUANTITATIVE, PREGNANCY: hCG, Beta Chain, Quant, S: 210789 m[IU]/mL — ABNORMAL HIGH (ref <5)

## 2017-11-10 LAB — ABO/RH: ABO/RH(D): B POS

## 2017-11-10 NOTE — ED Provider Notes (Signed)
[facility] EMERGENCY DEPARTMENT Provider Note   CSN: [id number] Arrival date & time: 11/10/17  0920     History   Chief Complaint Chief Complaint  Patient presents with  . Vaginal Bleeding    HPI Kim FickJamesha Ishmael is a 32 y.o. female.  Patient complains of vaginal bleeding.  She states she is [redacted] weeks pregnant.  She has not had any abdominal cramping  The history is provided by the patient. No language interpreter was used.  Vaginal Bleeding  Primary symptoms include vaginal bleeding. There has been no fever. This is a new problem. The current episode started 2 days ago. The problem occurs constantly. The problem has not changed since onset.The symptoms occur at rest. She is pregnant. The discharge was normal. Pertinent negatives include no anorexia, no abdominal pain, no diarrhea and no frequency. She has tried nothing for the symptoms. Sexual activity: sexually active. She uses nothing for contraception.    Past Medical History:  Diagnosis Date  . Allergy   . Seasonal asthma     Patient Active Problem List   Diagnosis Date Noted  . Moderate persistent asthma 04/29/2015  . 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 Perennial and seasonal allergic rhinoconjunctivitis 04/29/2015    Past Surgical History:  Procedure Laterality Date  . child birth  05/05/2011  . HERNIA REPAIR       OB History    Gravida  2   Para  1   Term  1   Preterm      AB      Living  1     SAB      TAB      Ectopic      Multiple      Live Births  1            Home Medications    Prior to Admission medications   Medication Sig Start Date End Date Taking? Authorizing Provider  albuterol (PROAIR HFA) 108 (90 Base) MCG/ACT inhaler Inhale 2 puffs into the lungs every 4 (four) hours as needed for wheezing or shortness of breath (cough). Reported on 05/11/2015   Yes [provider]  EPINEPHrine (EPIPEN 2-PAK) 0.3 mg/0.3 mL IJ SOAJ injection Inject 0.3 mg into  the muscle once. Reported on 05/11/2015   Yes [provider]  mometasone-formoterol (DULERA) 200-5 MCG/ACT AERO Inhale 2 puffs into the lungs 2 (two) times daily. 05/11/15  Yes Bobbitt, Heywood Ilesalph Carter, MD  Prenatal Vit-Fe Fumarate-FA (PRENATAL MULTIVITAMIN) TABS tablet Take 1 tablet by mouth daily at 12 noon.   Yes [provider]    Family History Family History  Problem Relation Age of Onset  . Asthma Brother   . Hyperlipidemia Maternal Grandmother   . Cancer Maternal Grandmother        Breast  . Arthritis Mother   . Hearing loss Mother   . Miscarriages / [place] Mother   . Hyperlipidemia Maternal Grandfather   . Heart disease Maternal Grandfather        in his 6840's  . Alcohol abuse Paternal Grandmother   . Stroke Father        suspected from substance  . Hypertension Father   . Cancer Other   . Allergic rhinitis Neg Hx   . Eczema Neg Hx   . Immunodeficiency Neg Hx   . Urticaria Neg Hx     Social History Social History   Tobacco Use  . Smoking status: Never Smoker  . Smokeless tobacco:  Never Used  Substance Use Topics  . Alcohol use: No    Alcohol/week: 0.0 standard drinks  . Drug use: No     Allergies   Other and Shellfish allergy   Review of Systems Review of Systems  Constitutional: Negative for appetite change and fatigue.  HENT: Negative for congestion, ear discharge and sinus pressure.   Eyes: Negative for discharge.  Respiratory: Negative for cough.   Cardiovascular: Negative for chest pain.  Gastrointestinal: Negative for abdominal pain, anorexia and diarrhea.  Genitourinary: Positive for vaginal bleeding. Negative for frequency and hematuria.  Musculoskeletal: Negative for back pain.  Skin: Negative for rash.  Neurological: Negative for seizures and headaches.  Psychiatric/Behavioral: Negative for hallucinations.     Physical Exam Updated Vital Signs BP 120/78 (BP Location: Right Arm)   Pulse 87   Temp 98.1 F (36.7 C)  (Oral)   Resp 17   Ht 5\' 2"  (1.575 m)   Wt 102.1 kg   LMP  (LMP Unknown)   SpO2 100%   BMI 41.15 kg/m   Physical Exam  Constitutional: She is oriented to person, place, and time. She appears well-developed.  HENT:  Head: Normocephalic.  Eyes: Conjunctivae and EOM are normal. No scleral icterus.  Neck: Neck supple. No thyromegaly present.  Cardiovascular: Normal rate and regular rhythm. Exam reveals no gallop and no friction rub.  No murmur heard. Pulmonary/Chest: No stridor. She has no wheezes. She has no rales. She exhibits no tenderness.  Abdominal: She exhibits no distension. There is no tenderness. There is no rebound.  Genitourinary:  Genitourinary Comments: Vaginal exam.  Patient has a small amount of blood in vault.  Office is closed  Musculoskeletal: Normal range of motion. She exhibits no edema.  Lymphadenopathy:    She has no cervical adenopathy.  Neurological: She is oriented to person, place, and time. She exhibits normal muscle tone. Coordination normal.  Skin: No rash noted. No erythema.  Psychiatric: She has a normal mood and affect. Her behavior is normal.     ED Treatments / Results  Labs (all labs ordered are listed, but only abnormal results are displayed) Labs Reviewed  COMPREHENSIVE METABOLIC PANEL - Abnormal; Notable for the following components:      Result Value   Potassium 3.4 (*)    Albumin 3.2 (*)    Alkaline Phosphatase 34 (*)    All other components within normal limits  HCG, QUANTITATIVE, PREGNANCY - Abnormal; Notable for the following components:   hCG, Beta Chain, Quant, S 210,789 (*)    All other components within normal limits  CBC WITH DIFFERENTIAL/PLATELET  ABO/RH    EKG None  Radiology US Ob Comp Less 14 Wks  Result Date: 11/10/2017 CLINICAL DATA:  Early pregnancy with vaginal bleeding after sneezing. EXAM: OBSTETRIC <14 WK [place] AND TRANSVAGINAL OB US TECHNIQUE: Both transabdominal and transvaginal ultrasound examinations were  performed for complete evaluation of the gestation as well as the maternal uterus, adnexal regions, and pelvic cul-de-sac. Transvaginal technique was performed to assess early pregnancy. COMPARISON:  None. FINDINGS: Intrauterine gestational sac: Present and normal. Yolk sac:  No longer visible. Embryo:  Present Cardiac Activity: Present Heart Rate: 153 bpm CRL:  67 mm   13 w   1 d                  [place] EDC: 05/17/2018 Subchorionic hemorrhage:  None visualized. Maternal uterus/adnexae: Normal IMPRESSION: Normal appearing living single intrauterine pregnancy at 13 weeks 1 day by crown-rump length.  No complication seen to explain vaginal bleeding. Electronically Signed   By: Paulina Fusi M.D.   On: 11/10/2017 11:04    Procedures Procedures (including critical care time)  Medications Ordered in ED Medications - No data to display   Initial Impression / Assessment and Plan / ED Course  I have reviewed the triage vital signs and the nursing notes.  Pertinent labs & imaging results that were available during my care of the patient were reviewed by me and considered in my medical decision making (see chart for details).    Ultrasound shows 13-week pregnancy no abnormalities.  Patient with small amount of vaginal bleeding and intrauterine pregnancy.  She is instructed to rest over the weekend follow-up with OBGyn next week and go to [facility] if she has heavy bleeding or pain  Final Clinical Impressions(s) / ED Diagnoses   Final diagnoses:  Vaginal bleeding    ED Discharge Orders    None       Bethann Berkshire, MD 11/10/17 1300

## 2017-11-10 NOTE — Discharge Instructions (Addendum)
Contact your OB/GYN today and make an appointment for next week.  Just rest over the weekend.  If you start having heavy bleeding or significant abdominal cramping then [place]

## 2017-11-10 NOTE — ED Triage Notes (Signed)
Pt reports vaginal bleeding after sneezing this morning approximately 20 minutes ago. Pt reports is [redacted] weeks pregnant.

## 2017-11-10 NOTE — ED Notes (Signed)
Pelvic kit set up at bedside

## 2017-12-05 ENCOUNTER — Encounter: Payer: Self-pay | Admitting: Obstetrics & Gynecology

## 2017-12-05 ENCOUNTER — Other Ambulatory Visit (HOSPITAL_COMMUNITY)
Admission: RE | Admit: 2017-12-05 | Discharge: 2017-12-05 | Disposition: A | Discharge status: Home / Self Care | Payer: Medicaid Other | Source: Ambulatory Visit | Attending: Obstetrics & Gynecology | Admitting: Obstetrics & Gynecology

## 2017-12-05 ENCOUNTER — Ambulatory Visit (INDEPENDENT_AMBULATORY_CARE_PROVIDER_SITE_OTHER): Payer: Medicaid Other | Admitting: Obstetrics & Gynecology

## 2017-12-05 DIAGNOSIS — Z3A16 16 weeks gestation of pregnancy: Secondary | ICD-10-CM

## 2017-12-05 DIAGNOSIS — Z3480 Encounter for supervision of other normal pregnancy, unspecified trimester: Secondary | ICD-10-CM | POA: Diagnosis not present

## 2017-12-05 DIAGNOSIS — Z3482 Encounter for supervision of other normal pregnancy, second trimester: Secondary | ICD-10-CM

## 2017-12-05 NOTE — Patient Instructions (Signed)
Second Trimester of Pregnancy The second trimester is from week 13 through week 28, month 4 through 6. This is often the time in pregnancy that you feel your best. Often times, morning sickness has lessened or quit. You may have more energy, and you may get hungry more often. Your unborn baby (fetus) is growing rapidly. At the end of the sixth month, he or she is about 9 inches long and weighs about 1 pounds. You will likely feel the baby move (quickening) between 18 and 20 weeks of pregnancy. Follow these instructions at home:  Avoid all smoking, herbs, and alcohol. Avoid drugs not approved by your doctor.  Do not use any tobacco products, including cigarettes, chewing tobacco, and electronic cigarettes. If you need help quitting, ask your doctor. You may get counseling or other support to help you quit.  Only take medicine as told by your doctor. Some medicines are safe and some are not during pregnancy.  Exercise only as told by your doctor. Stop exercising if you start having cramps.  Eat regular, healthy meals.  Wear a good support bra if your breasts are tender.  Do not use hot tubs, steam rooms, or saunas.  Wear your seat belt when driving.  Avoid raw meat, uncooked cheese, and liter boxes and soil used by cats.  Take your prenatal vitamins.  Take 1500-2000 milligrams of calcium daily starting at the 20th week of pregnancy until you deliver your baby.  Try taking medicine that helps you poop (stool softener) as needed, and if your doctor approves. Eat more fiber by eating fresh fruit, vegetables, and whole grains. Drink enough fluids to keep your pee (urine) clear or pale yellow.  Take warm water baths (sitz baths) to soothe pain or discomfort caused by hemorrhoids. Use hemorrhoid cream if your doctor approves.  If you have puffy, bulging veins (varicose veins), wear support hose. Raise (elevate) your feet for 15 minutes, 3-4 times a day. Limit salt in your diet.  Avoid heavy  lifting, wear low heals, and sit up straight.  Rest with your legs raised if you have leg cramps or low back pain.  Visit your dentist if you have not gone during your pregnancy. Use a soft toothbrush to brush your teeth. Be gentle when you floss.  You can have sex (intercourse) unless your doctor tells you not to.  Go to your doctor visits. Get help if:  You feel dizzy.  You have mild cramps or pressure in your lower belly (abdomen).  You have a nagging pain in your belly area.  You continue to feel sick to your stomach (nauseous), throw up (vomit), or have watery poop (diarrhea).  You have bad smelling fluid coming from your vagina.  You have pain with peeing (urination). Get help right away if:  You have a fever.  You are leaking fluid from your vagina.  You have spotting or bleeding from your vagina.  You have severe belly cramping or pain.  You lose or gain weight rapidly.  You have trouble catching your breath and have chest pain.  You notice sudden or extreme puffiness (swelling) of your face, hands, ankles, feet, or legs.  You have not felt the baby move in over an hour.  You have severe headaches that do not go away with medicine.  You have vision changes. This information is not intended to replace advice given to you by your health care provider. Make sure you discuss any questions you have with your health care   provider. Document Released: 05/25/2009 Document Revised: 08/06/2015 Document Reviewed: 05/01/2012 Elsevier Interactive Patient Education  2017 Elsevier Inc.  

## 2017-12-05 NOTE — Progress Notes (Signed)
Subjective:    Kim White is a G2P1001 [redacted]w[redacted]d being seen today for her first obstetrical visit.  Her obstetrical history is significant for obesity. Patient does intend to breast feed. Pregnancy history fully reviewed.  Patient reports no complaints.  Vitals:   12/05/17 1331  BP: 119/78  Pulse: 84  Weight: 227 lb (103 kg)    HISTORY: OB History  Gravida Para Term Preterm AB Living  2 1 1     1   SAB TAB Ectopic Multiple Live Births          1    # Outcome Date GA Lbr Len/2nd Weight Sex Delivery Anes PTL Lv  2 Current           1 Term 05/05/11 [redacted]w[redacted]d  6 lb 9 oz (2.977 kg) M Vag-Spont   LIV   Past Medical History:  Diagnosis Date  . Allergy   . Seasonal asthma    Past Surgical History:  Procedure Laterality Date  . child birth  05/05/2011  . HERNIA REPAIR     Family History  Problem Relation Age of Onset  . Asthma Brother   . Hyperlipidemia Maternal Grandmother   . Cancer Maternal Grandmother        Breast  . Arthritis Mother   . Hearing loss Mother   . Miscarriages / [place] Mother   . Hyperlipidemia Maternal Grandfather   . Heart disease Maternal Grandfather        in his 16's  . Alcohol abuse Paternal Grandmother   . Stroke Father        suspected from substance  . Hypertension Father   . Cancer Other   . Allergic rhinitis Neg Hx   . Eczema Neg Hx   . Immunodeficiency Neg Hx   . Urticaria Neg Hx      Exam    Uterus:     Pelvic Exam:    Perineum: No Hemorrhoids   Vulva: normal   Vagina:  normal mucosa   pH:     Cervix: no lesions   Adnexa: normal adnexa   Bony Pelvis: average  System: Breast:  normal appearance, no masses or tenderness   Skin: normal coloration and turgor, no rashes    Neurologic: oriented, normal mood   Extremities: normal strength, tone, and muscle mass   HEENT PERRLA, sclera clear, anicteric, neck supple with midline trachea and thyroid without masses   Mouth/Teeth mucous membranes moist, pharynx normal without  lesions and dental hygiene good   Neck supple   Cardiovascular: regular rate and rhythm, no murmurs or gallops   Respiratory:  appears well, vitals normal, no respiratory distress, acyanotic, normal RR, neck free of mass or lymphadenopathy, chest clear, no wheezing, crepitations, rhonchi, normal symmetric air entry   Abdomen: soft, non-tender; bowel sounds normal; no masses,  no organomegaly   Urinary: urethral meatus normal      Assessment:    Pregnancy: G2P1001 Patient Active Problem List   Diagnosis Date Noted  . Supervision of other normal pregnancy, antepartum 12/05/2017  . Moderate persistent asthma 04/29/2015  . 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 Perennial and seasonal allergic rhinoconjunctivitis 04/29/2015        Plan:     Initial labs drawn. Prenatal vitamins. Problem list reviewed and updated. Genetic Screening discussed Panorama and AFP ordered.  Ultrasound discussed; fetal survey: ordered.  Follow up in 4 weeks. 50% of 30 min visit spent on counseling and coordination of care.  Scheryl DarterJames Arnold 12/05/2017

## 2017-12-06 LAB — CERVICOVAGINAL ANCILLARY ONLY
CHLAMYDIA, DNA PROBE: NEGATIVE
Neisseria Gonorrhea: NEGATIVE

## 2017-12-07 LAB — AFP, SERUM, OPEN SPINA BIFIDA
AFP MoM: 2.17
AFP Value: 64 ng/mL
GEST. AGE ON COLLECTION DATE: 16.5 wk
MATERNAL AGE AT EDD: 30.8 a
OSBR Risk 1 IN: 1115
Test Results:: NEGATIVE
WEIGHT: 227 [lb_av]

## 2017-12-09 LAB — URINE CULTURE, OB REFLEX

## 2017-12-09 LAB — CULTURE, OB URINE

## 2017-12-11 ENCOUNTER — Encounter: Payer: Self-pay | Admitting: Obstetrics & Gynecology

## 2017-12-12 LAB — OBSTETRIC PANEL, INCLUDING HIV
ANTIBODY SCREEN: NEGATIVE
BASOS: 0 %
Basophils Absolute: 0 10*3/uL (ref 0.0–0.2)
EOS (ABSOLUTE): 0.2 10*3/uL (ref 0.0–0.4)
EOS: 3 %
HEMATOCRIT: 38.1 % (ref 34.0–46.6)
HEP B S AG: NEGATIVE
HIV Screen 4th Generation wRfx: NONREACTIVE
Hemoglobin: 12.8 g/dL (ref 11.1–15.9)
IMMATURE GRANS (ABS): 0 10*3/uL (ref 0.0–0.1)
Immature Granulocytes: 1 %
LYMPHS: 18 %
Lymphocytes Absolute: 1.3 10*3/uL (ref 0.7–3.1)
MCH: 26.8 pg (ref 26.6–33.0)
MCHC: 33.6 g/dL (ref 31.5–35.7)
MCV: 80 fL (ref 79–97)
MONOCYTES: 9 %
MONOS ABS: 0.6 10*3/uL (ref 0.1–0.9)
Neutrophils Absolute: 5.1 10*3/uL (ref 1.4–7.0)
Neutrophils: 69 %
Platelets: 218 10*3/uL (ref 150–450)
RBC: 4.77 x10E6/uL (ref 3.77–5.28)
RDW: 13 % (ref 12.3–15.4)
RH TYPE: POSITIVE
RPR: NONREACTIVE
RUBELLA: 1.95 {index} (ref >0.99)
WBC: 7.2 10*3/uL (ref 3.4–10.8)

## 2017-12-12 LAB — HEMOGLOBINOPATHY EVALUATION
HGB C: 0 %
HGB S: 0 %
HGB VARIANT: 0 %
Hemoglobin A2 Quantitation: 2.4 % (ref 1.8–3.2)
Hemoglobin F Quantitation: 1.3 % (ref 0.0–2.0)
Hgb A: 96.3 % — ABNORMAL LOW (ref 96.4–98.8)

## 2017-12-12 LAB — CYSTIC FIBROSIS MUTATION 97: GENE DIS ANAL CARRIER INTERP BLD/T-IMP: NOT DETECTED

## 2017-12-21 ENCOUNTER — Inpatient Hospital Stay (HOSPITAL_COMMUNITY): Admission: RE | Admit: 2017-12-21 | Payer: Medicaid Other | Source: Ambulatory Visit

## 2017-12-26 ENCOUNTER — Ambulatory Visit (HOSPITAL_COMMUNITY)
Admission: RE | Admit: 2017-12-26 | Discharge: 2017-12-26 | Disposition: A | Discharge status: Home / Self Care | Payer: Medicaid Other | Source: Ambulatory Visit | Attending: Obstetrics & Gynecology | Admitting: Obstetrics & Gynecology

## 2017-12-26 ENCOUNTER — Other Ambulatory Visit (HOSPITAL_COMMUNITY): Payer: Self-pay | Admitting: *Deleted

## 2017-12-26 ENCOUNTER — Other Ambulatory Visit: Payer: Self-pay | Admitting: Obstetrics & Gynecology

## 2017-12-26 DIAGNOSIS — Z363 Encounter for antenatal screening for malformations: Secondary | ICD-10-CM | POA: Diagnosis not present

## 2017-12-26 DIAGNOSIS — Z3A19 19 weeks gestation of pregnancy: Secondary | ICD-10-CM

## 2017-12-26 DIAGNOSIS — O99212 Obesity complicating pregnancy, second trimester: Secondary | ICD-10-CM

## 2017-12-26 DIAGNOSIS — Z362 Encounter for other antenatal screening follow-up: Secondary | ICD-10-CM

## 2017-12-26 DIAGNOSIS — Z3480 Encounter for supervision of other normal pregnancy, unspecified trimester: Secondary | ICD-10-CM

## 2018-01-02 ENCOUNTER — Ambulatory Visit (INDEPENDENT_AMBULATORY_CARE_PROVIDER_SITE_OTHER): Payer: Medicaid Other | Admitting: Obstetrics & Gynecology

## 2018-01-02 ENCOUNTER — Encounter: Payer: Medicaid Other | Admitting: Obstetrics & Gynecology

## 2018-01-02 ENCOUNTER — Encounter: Payer: Self-pay | Admitting: Obstetrics & Gynecology

## 2018-01-02 DIAGNOSIS — Z3480 Encounter for supervision of other normal pregnancy, unspecified trimester: Secondary | ICD-10-CM

## 2018-01-02 DIAGNOSIS — Z3482 Encounter for supervision of other normal pregnancy, second trimester: Secondary | ICD-10-CM

## 2018-01-02 DIAGNOSIS — Z3A20 20 weeks gestation of pregnancy: Secondary | ICD-10-CM | POA: Diagnosis not present

## 2018-01-02 NOTE — Patient Instructions (Signed)
Second Trimester of Pregnancy The second trimester is from week 13 through week 28, month 4 through 6. This is often the time in pregnancy that you feel your best. Often times, morning sickness has lessened or quit. You may have more energy, and you may get hungry more often. Your unborn baby (fetus) is growing rapidly. At the end of the sixth month, he or she is about 9 inches long and weighs about 1 pounds. You will likely feel the baby move (quickening) between 18 and 20 weeks of pregnancy. Follow these instructions at home:  Avoid all smoking, herbs, and alcohol. Avoid drugs not approved by your doctor.  Do not use any tobacco products, including cigarettes, chewing tobacco, and electronic cigarettes. If you need help quitting, ask your doctor. You may get counseling or other support to help you quit.  Only take medicine as told by your doctor. Some medicines are safe and some are not during pregnancy.  Exercise only as told by your doctor. Stop exercising if you start having cramps.  Eat regular, healthy meals.  Wear a good support bra if your breasts are tender.  Do not use hot tubs, steam rooms, or saunas.  Wear your seat belt when driving.  Avoid raw meat, uncooked cheese, and liter boxes and soil used by cats.  Take your prenatal vitamins.  Take 1500-2000 milligrams of calcium daily starting at the 20th week of pregnancy until you deliver your baby.  Try taking medicine that helps you poop (stool softener) as needed, and if your doctor approves. Eat more fiber by eating fresh fruit, vegetables, and whole grains. Drink enough fluids to keep your pee (urine) clear or pale yellow.  Take warm water baths (sitz baths) to soothe pain or discomfort caused by hemorrhoids. Use hemorrhoid cream if your doctor approves.  If you have puffy, bulging veins (varicose veins), wear support hose. Raise (elevate) your feet for 15 minutes, 3-4 times a day. Limit salt in your diet.  Avoid heavy  lifting, wear low heals, and sit up straight.  Rest with your legs raised if you have leg cramps or low back pain.  Visit your dentist if you have not gone during your pregnancy. Use a soft toothbrush to brush your teeth. Be gentle when you floss.  You can have sex (intercourse) unless your doctor tells you not to.  Go to your doctor visits. Get help if:  You feel dizzy.  You have mild cramps or pressure in your lower belly (abdomen).  You have a nagging pain in your belly area.  You continue to feel sick to your stomach (nauseous), throw up (vomit), or have watery poop (diarrhea).  You have bad smelling fluid coming from your vagina.  You have pain with peeing (urination). Get help right away if:  You have a fever.  You are leaking fluid from your vagina.  You have spotting or bleeding from your vagina.  You have severe belly cramping or pain.  You lose or gain weight rapidly.  You have trouble catching your breath and have chest pain.  You notice sudden or extreme puffiness (swelling) of your face, hands, ankles, feet, or legs.  You have not felt the baby move in over an hour.  You have severe headaches that do not go away with medicine.  You have vision changes. This information is not intended to replace advice given to you by your health care provider. Make sure you discuss any questions you have with your health care   provider. Document Released: 05/25/2009 Document Revised: 08/06/2015 Document Reviewed: 05/01/2012 Elsevier Interactive Patient Education  2017 Elsevier Inc.  

## 2018-01-02 NOTE — Progress Notes (Signed)
   PRENATAL VISIT NOTE  Subjective:  Kim White is a 32 y.o. G2P1001 at [redacted]w[redacted]d being seen today for ongoing prenatal care.  She is currently monitored for the following issues for this low-risk pregnancy and has Moderate persistent asthma; Allergy with anaphylaxis due to food; Perennial and seasonal allergic rhinoconjunctivitis; and Supervision of other normal pregnancy, antepartum on their problem list.  Patient reports no complaints.  Contractions: Not present. Vag. Bleeding: None.  Movement: Present. Denies leaking of fluid.   The following portions of the patient's history were reviewed and updated as appropriate: allergies, current medications, past family history, past medical history, past social history, past surgical history and problem list. Problem list updated.  Objective:   Vitals:   01/02/18 1039  BP: 115/78  Pulse: 87  Weight: 224 lb (101.6 kg)    Fetal Status: Fetal Heart Rate (bpm): 152   Movement: Present     General:  Alert, oriented and cooperative. Patient is in no acute distress.  Skin: Skin is warm and dry. No rash noted.   Cardiovascular: Normal heart rate noted  Respiratory: Normal respiratory effort, no problems with respiration noted  Abdomen: Soft, gravid, appropriate for gestational age.  Pain/Pressure: Present     Pelvic: Cervical exam deferred        Extremities: Normal range of motion.     Mental Status: Normal mood and affect. Normal behavior. Normal judgment and thought content.   Assessment and Plan:  Pregnancy: G2P1001 at [redacted]w[redacted]d  1. Supervision of other normal pregnancy, antepartum [place] was normal as was genetic screening  Preterm labor symptoms and general obstetric precautions including but not limited to vaginal bleeding, contractions, leaking of fluid and fetal movement were reviewed in detail with the patient. Please refer to After Visit Summary for other counseling recommendations.  Return in about 4 weeks (around 01/30/2018).  Future  Appointments  Date Time Provider Department Center  02/02/2018  1:15 PM [facility] [place] 4 [facility]    Scheryl Darter, MD

## 2018-02-02 ENCOUNTER — Ambulatory Visit (HOSPITAL_COMMUNITY)
Admission: RE | Admit: 2018-02-02 | Discharge: 2018-02-02 | Disposition: A | Discharge status: Home / Self Care | Payer: Medicaid Other | Source: Ambulatory Visit | Attending: Obstetrics & Gynecology | Admitting: Obstetrics & Gynecology

## 2018-02-02 ENCOUNTER — Encounter: Payer: Self-pay | Admitting: Obstetrics

## 2018-02-02 ENCOUNTER — Ambulatory Visit (INDEPENDENT_AMBULATORY_CARE_PROVIDER_SITE_OTHER): Payer: Medicaid Other | Admitting: Obstetrics

## 2018-02-02 VITALS — BP 105/73 | HR 92 | Wt 226.0 lb

## 2018-02-02 DIAGNOSIS — E669 Obesity, unspecified: Secondary | ICD-10-CM | POA: Diagnosis not present

## 2018-02-02 DIAGNOSIS — Z362 Encounter for other antenatal screening follow-up: Secondary | ICD-10-CM | POA: Diagnosis not present

## 2018-02-02 DIAGNOSIS — M25552 Pain in left hip: Secondary | ICD-10-CM | POA: Diagnosis not present

## 2018-02-02 DIAGNOSIS — Z3482 Encounter for supervision of other normal pregnancy, second trimester: Secondary | ICD-10-CM

## 2018-02-02 DIAGNOSIS — O339 Maternal care for disproportion, unspecified: Secondary | ICD-10-CM | POA: Diagnosis not present

## 2018-02-02 DIAGNOSIS — M25551 Pain in right hip: Secondary | ICD-10-CM | POA: Diagnosis not present

## 2018-02-02 DIAGNOSIS — Z363 Encounter for antenatal screening for malformations: Secondary | ICD-10-CM

## 2018-02-02 DIAGNOSIS — O99212 Obesity complicating pregnancy, second trimester: Secondary | ICD-10-CM | POA: Insufficient documentation

## 2018-02-02 DIAGNOSIS — Z3A25 25 weeks gestation of pregnancy: Secondary | ICD-10-CM

## 2018-02-02 DIAGNOSIS — Z3480 Encounter for supervision of other normal pregnancy, unspecified trimester: Secondary | ICD-10-CM

## 2018-02-02 DIAGNOSIS — Z3A21 21 weeks gestation of pregnancy: Secondary | ICD-10-CM

## 2018-02-02 DIAGNOSIS — O9989 Other specified diseases and conditions complicating pregnancy, childbirth and the puerperium: Secondary | ICD-10-CM | POA: Diagnosis not present

## 2018-02-02 MED ORDER — COMFORT FIT MATERNITY SUPP SM MISC: 0 refills | Status: DC

## 2018-02-02 NOTE — Progress Notes (Signed)
Subjective:  Kim White is a 32 y.o. G2P1001 at 7666w1d being seen today for ongoing prenatal care.  She is currently monitored for the following issues for this low-risk pregnancy and has Moderate persistent asthma; Allergy with anaphylaxis due to food; Perennial and seasonal allergic rhinoconjunctivitis; and Supervision of other normal pregnancy, antepartum on their problem list.   Patient reports hip pain.   She had a fall off horse years ago, and has had hip pain since then.   .  .   . Denies leaking of fluid.   The following portions of the patient's history were reviewed and updated as appropriate: allergies, current medications, past family history, past medical history, past social history, past surgical history and problem list. Problem list updated.  Objective:  There were no vitals filed for this visit.  Fetal Status:           General:  Alert, oriented and cooperative. Patient is in no acute distress.  Skin: Skin is warm and dry. No rash noted.   Cardiovascular: Normal heart rate noted  Respiratory: Normal respiratory effort, no problems with respiration noted  Abdomen: Soft, gravid, appropriate for gestational age.       Pelvic:  Cervical exam deferred        Extremities: Normal range of motion.     Mental Status: Normal mood and affect. Normal behavior. Normal judgment and thought content.   Urinalysis:      Assessment and Plan:  Pregnancy: G2P1001 at 7766w1d  1. Supervision of other normal pregnancy, antepartum  2. Pain of both hip joints Rx - Ambulatory referral to Orthopedics - Elastic Bandages & Supports (COMFORT FIT MATERNITY SUPP SM) MISC; Wear as directed.  Dispense: 1 each; Refill: 0   Preterm labor symptoms and general obstetric precautions including but not limited to vaginal bleeding, contractions, leaking of fluid and fetal movement were reviewed in detail with the patient. Please refer to After Visit Summary for other counseling recommendations.  Return  in about 3 weeks (around 02/23/2018) for ROB, 2 hour OGTT.   Brock BadHarper, Aydrian Halpin A, MD

## 2018-02-13 ENCOUNTER — Ambulatory Visit: Payer: Medicaid Other | Attending: Obstetrics | Admitting: Physical Therapy

## 2018-02-13 ENCOUNTER — Other Ambulatory Visit: Payer: Self-pay

## 2018-02-13 ENCOUNTER — Encounter: Payer: Self-pay | Admitting: Physical Therapy

## 2018-02-13 DIAGNOSIS — R262 Difficulty in walking, not elsewhere classified: Secondary | ICD-10-CM | POA: Insufficient documentation

## 2018-02-13 DIAGNOSIS — M25552 Pain in left hip: Secondary | ICD-10-CM | POA: Diagnosis not present

## 2018-02-13 DIAGNOSIS — M25652 Stiffness of left hip, not elsewhere classified: Secondary | ICD-10-CM | POA: Insufficient documentation

## 2018-02-13 NOTE — Patient Instructions (Signed)
Access Code: [email or web address]  URL: [email or web address]  Date: 02/13/2018  Prepared by: Stacie GlazeMichael Kionna Brier   Exercises  Supine ITB Stretch with Strap - 5 reps - 3 sets - 20 hold - 2x daily - 7x weekly  Supine Piriformis Stretch Pulling Heel to Hip - 5 reps - 3 sets - 20 hold - 2x daily - 7x weekly  Seated Hamstring Stretch with Chair - 5 reps - 3 sets - 60 hold - 2x daily - 7x weekly  Standing Gastroc Stretch on Step with Counter Support - 5 reps - 3 sets - 20 hold - 2x daily - 7x weekly  Supine Quadriceps Stretch with Strap on Table - 5 reps - 1 sets - 20 hold - 2x daily - 7x weekly

## 2018-02-13 NOTE — Therapy (Signed)
[facility] [address] [place], [place], [address] Phone: [phone]   Fax:  [phone]  Physical Therapy Evaluation  Patient Details  Name: Kim White MRN: [id number] Date of Birth: 1986-01-03 Referring Provider (PT): Coral Ceo   Encounter Date: 02/13/2018  PT End of Session - 02/13/18 1000    Visit Number  1    Number of Visits  4    Date for PT Re-Evaluation  03/13/18    Authorization Type  Medicaid    PT Start Time  0923    PT Stop Time  1010    PT Time Calculation (min)  47 min    Activity Tolerance  Patient tolerated treatment well    Behavior During Therapy  [facility] for tasks assessed/performed       Past Medical History:  Diagnosis Date  . Allergy   . Seasonal asthma     Past Surgical History:  Procedure Laterality Date  . child birth  05/05/2011  . HERNIA REPAIR      There were no vitals filed for this visit.   Subjective Assessment - 02/13/18 0932    Subjective  Reports that she fell off a horse 8 years ago, reports that she has had some issues since that time.  She reports that the pain and difficulty walking has gotten worse.      Limitations  Walking    Patient Stated Goals  have less pain, be stronger    Currently in Pain?  Yes    Pain Score  3     Pain Location  Hip    Pain Orientation  Left;Right    Pain Descriptors / Indicators  Aching    Pain Type  Chronic pain    Pain Onset  More than a month ago    Pain Frequency  Constant    Aggravating Factors   worse with walking, getting in and out of car pain is up to a 10/10    Pain Relieving Factors  rest helps, at best pain a 2-3/10    Effect of Pain on Daily Activities  difficulty in and out of car, sleeping, walking         [facility] PT Assessment - 02/13/18 0001      Assessment   Medical Diagnosis  hip pain, left > right    Referring Provider (PT)  Coral Ceo    Onset Date/Surgical Date  01/14/18    Prior Therapy  no       Precautions   Precaution Comments  she is [redacted] weeks pregnant      Balance Screen   Has the patient fallen in the past 6 months  No    Has the patient had a decrease in activity level because of a fear of falling?   No    Is the patient reluctant to leave their home because of a fear of falling?   No      Home Environment   Additional Comments  steps into the home, has a 49 and 39 year old at home. does housework      Prior Function   Level of Independence  Independent    Vocation  Unemployed    Leisure  walks some for exercise      Posture/Postural Control   Posture Comments  fwd head rounded shoulders      ROM / Strength   AROM / PROM / Strength  AROM;Strength  AROM   Overall AROM Comments  Lumbar ROM WNL's with pain, hip motions caused pain    AROM Assessment Site  Hip    Right/Left Hip  Left    Left Hip Extension  10    Left Hip Flexion  60    Left Hip External Rotation   20    Left Hip ABduction  20      Strength   Overall Strength Comments  right hip 4/5, left hip 4-/5 with pain      Flexibility   Soft Tissue Assessment /Muscle Length  yes    Hamstrings  tight    Quadriceps  very tight    ITB  tight    Piriformis  very tigfht      Palpation   Palpation comment  tender in the left buttock, lateral hip and left anterior hip area                Objective measurements completed on examination: See above findings.                PT Short Term Goals - 02/13/18 1003      PT SHORT TERM GOAL #1   Title  indepednent with initial HEP    Time  2    Period  Weeks    Status  New        PT Long Term Goals - 02/13/18 1003      PT LONG TERM GOAL #1   Title  decrease pain 25%    Time  8    Period  Weeks    Status  New      PT LONG TERM GOAL #2   Title  increase hip flexion to 90 degrees    Time  8    Period  Weeks    Status  New      PT LONG TERM GOAL #3   Title  report get up from sitting without difficulty    Time  8     Period  Weeks    Status  New             Plan - 02/13/18 1001    Clinical Impression Statement  Patient reports falling off of a horse > 8 years ago, x-rays at that time are negative.  She reports that she has had some pain since that time, worse over the past two years, worse with walking, getting in and out of car and difficulty getting up from sitting.  She is very tight in the hip mms.  Has some weakness with pain.  Has antalgic gait on the left    History and Personal Factors relevant to plan of care:  she is pregnant    Clinical Presentation  Stable    Clinical Decision Making  Low    Rehab Potential  Fair    PT Frequency  1x / week    PT Duration  8 weeks    PT Treatment/Interventions  ADLs/Self Care Home Management;Electrical Stimulation;Moist Heat;Ultrasound;Functional mobility training;Stair training;Therapeutic activities;Therapeutic exercise;Manual techniques;Patient/family education    Consulted and Agree with Plan of Care  Patient       Patient will benefit from skilled therapeutic intervention in order to improve the following deficits and impairments:  Abnormal gait, Decreased range of motion, Difficulty walking, Increased muscle spasms, Pain, Decreased activity tolerance, Impaired flexibility, Improper body mechanics, Decreased strength, Decreased mobility  Visit Diagnosis: Pain in left hip - Plan: PT plan of care cert/re-cert  Stiffness of left hip, not elsewhere classified - Plan: PT plan of care cert/re-cert  Difficulty in walking, not elsewhere classified - Plan: PT plan of care cert/re-cert     Problem List Patient Active Problem List   Diagnosis Date Noted  . Supervision of other normal pregnancy, antepartum 12/05/2017  . Moderate persistent asthma 04/29/2015  . 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 Perennial and seasonal allergic rhinoconjunctivitis 04/29/2015    Jearld LeschALBRIGHT,Aaria Happ W., PT 02/13/2018, 10:17 AM  [facility] [address] [place], [place], [address] Phone: [phone]   Fax:  [phone]  Name: Kim White MRN: [id number] Date of Birth: [birth year]

## 2018-02-19 ENCOUNTER — Ambulatory Visit: Payer: Medicaid Other | Admitting: Physical Therapy

## 2018-02-20 ENCOUNTER — Encounter: Payer: Medicaid Other | Admitting: Physical Therapy

## 2018-02-23 ENCOUNTER — Ambulatory Visit (INDEPENDENT_AMBULATORY_CARE_PROVIDER_SITE_OTHER): Payer: Medicaid Other | Admitting: Obstetrics

## 2018-02-23 ENCOUNTER — Encounter: Payer: Self-pay | Admitting: Obstetrics

## 2018-02-23 ENCOUNTER — Other Ambulatory Visit: Payer: Medicaid Other

## 2018-02-23 VITALS — BP 112/77 | HR 88

## 2018-02-23 DIAGNOSIS — Z3A28 28 weeks gestation of pregnancy: Secondary | ICD-10-CM | POA: Diagnosis not present

## 2018-02-23 DIAGNOSIS — Z3483 Encounter for supervision of other normal pregnancy, third trimester: Secondary | ICD-10-CM

## 2018-02-23 DIAGNOSIS — Z3480 Encounter for supervision of other normal pregnancy, unspecified trimester: Secondary | ICD-10-CM

## 2018-02-23 NOTE — Progress Notes (Signed)
Pt is here for ROB and GTT G2P1 2743w1d.

## 2018-02-23 NOTE — Progress Notes (Signed)
Subjective:  Kim White is a 32 y.o. G2P1001 at 8838w1d being seen today for ongoing prenatal care.  She is currently monitored for the following issues for this low-risk pregnancy and has Moderate persistent asthma; Allergy with anaphylaxis due to food; Perennial and seasonal allergic rhinoconjunctivitis; and Supervision of other normal pregnancy, antepartum on their problem list.  Patient reports no complaints.  Contractions: Not present. Vag. Bleeding: None.  Movement: Present. Denies leaking of fluid.   The following portions of the patient's history were reviewed and updated as appropriate: allergies, current medications, past family history, past medical history, past social history, past surgical history and problem list. Problem list updated.  Objective:   Vitals:   02/23/18 0826  BP: 112/77  Pulse: 88    Fetal Status:     Movement: Present     General:  Alert, oriented and cooperative. Patient is in no acute distress.  Skin: Skin is warm and dry. No rash noted.   Cardiovascular: Normal heart rate noted  Respiratory: Normal respiratory effort, no problems with respiration noted  Abdomen: Soft, gravid, appropriate for gestational age. Pain/Pressure: Present     Pelvic:  Cervical exam deferred        Extremities: Normal range of motion.  Edema: None  Mental Status: Normal mood and affect. Normal behavior. Normal judgment and thought content.   Urinalysis:      Assessment and Plan:  Pregnancy: G2P1001 at 5738w1d  1. Supervision of other normal pregnancy, antepartum Rx: - Glucose Tolerance, 2 Hours w/1 Hour - CBC - RPR - HIV Antibody (routine testing w rflx)  Preterm labor symptoms and general obstetric precautions including but not limited to vaginal bleeding, contractions, leaking of fluid and fetal movement were reviewed in detail with the patient. Please refer to After Visit Summary for other counseling recommendations.  Return in about 2 weeks (around 03/09/2018) for  ROB.   Brock BadHarper, Charles A, MD

## 2018-02-24 LAB — CBC
Hematocrit: 37.4 % (ref 34.0–46.6)
Hemoglobin: 12.8 g/dL (ref 11.1–15.9)
MCH: 27 pg (ref 26.6–33.0)
MCHC: 34.2 g/dL (ref 31.5–35.7)
MCV: 79 fL (ref 79–97)
Platelets: 216 10*3/uL (ref 150–450)
RBC: 4.74 x10E6/uL (ref 3.77–5.28)
RDW: 13.2 % (ref 12.3–15.4)
WBC: 7 10*3/uL (ref 3.4–10.8)

## 2018-02-24 LAB — GLUCOSE TOLERANCE, 2 HOURS W/ 1HR
GLUCOSE, 1 HOUR: 96 mg/dL (ref 65–179)
GLUCOSE, 2 HOUR: 99 mg/dL (ref 65–152)
Glucose, Fasting: 76 mg/dL (ref 65–91)

## 2018-02-24 LAB — HIV ANTIBODY (ROUTINE TESTING W REFLEX): HIV SCREEN 4TH GENERATION: NONREACTIVE

## 2018-02-24 LAB — RPR: RPR Ser Ql: NONREACTIVE

## 2018-02-26 ENCOUNTER — Ambulatory Visit: Payer: Medicaid Other | Admitting: Physical Therapy

## 2018-02-27 ENCOUNTER — Ambulatory Visit: Payer: Medicaid Other | Admitting: Physical Therapy

## 2018-02-27 ENCOUNTER — Encounter: Payer: Medicaid Other | Admitting: Physical Therapy

## 2018-02-27 ENCOUNTER — Encounter: Payer: Self-pay | Admitting: Physical Therapy

## 2018-02-27 DIAGNOSIS — R262 Difficulty in walking, not elsewhere classified: Secondary | ICD-10-CM

## 2018-02-27 DIAGNOSIS — M25552 Pain in left hip: Secondary | ICD-10-CM

## 2018-02-27 DIAGNOSIS — M25652 Stiffness of left hip, not elsewhere classified: Secondary | ICD-10-CM | POA: Diagnosis not present

## 2018-02-27 NOTE — Therapy (Signed)
[facility] [address] [place], [place], [address] Phone: [phone]   Fax:  [phone]  Physical Therapy Treatment  Patient Details  Name: Kim FickJamesha White MRN: [id number] Date of Birth: 1985-11-23 Referring Provider (PT): Coral Ceoharles Harper   Encounter Date: 02/27/2018  PT End of Session - 02/27/18 1145    Visit Number  2    Number of Visits  4    Date for PT Re-Evaluation  03/13/18    Authorization Type  Medicaid    PT Start Time  1100    PT Stop Time  1158    PT Time Calculation (min)  58 min    Activity Tolerance  Patient tolerated treatment well    Behavior During Therapy  [facility] for tasks assessed/performed       Past Medical History:  Diagnosis Date  . Allergy   . Seasonal asthma     Past Surgical History:  Procedure Laterality Date  . child birth  05/05/2011  . HERNIA REPAIR      There were no vitals filed for this visit.  Subjective Assessment - 02/27/18 1106    Subjective  "Fine, just some stiffness"    Currently in Pain?  No/denies    Pain Score  0-No pain                       [facility] Adult PT Treatment/Exercise - 02/27/18 0001      Exercises   Exercises  Knee/Hip      Knee/Hip Exercises: Aerobic   Recumbent Bike  L0 x 5 minutes      Knee/Hip Exercises: Standing   Hip Abduction  Knee straight;10 reps;1 set;Both;Stengthening    Hip Extension  Knee straight;Both;1 set;10 reps    Lateral Step Up  Both;1 set;10 reps;Hand Hold: 0;Step Height: 4"    Forward Step Up  Both;1 set;Hand Hold: 0;Step Height: 6";10 reps      Knee/Hip Exercises: Seated   Ball Squeeze  2x10    Marching  2 sets;10 reps;Both    Abduction/Adduction   15 reps;2 sets;Strengthening    Abd/Adduction Limitations  green tband     Sit to Sand  1 set;10 reps;without UE support      Modalities   Modalities  Moist Heat;Electrical Stimulation      Moist Heat Therapy   Number Minutes Moist Heat  15 Minutes     Moist Heat Location  Lumbar Spine      Electrical Stimulation   Electrical Stimulation Location  L low back    Electrical Stimulation Action  IFC    Electrical Stimulation Parameters  supine to pt tolerance     Electrical Stimulation Goals  Pain               PT Short Term Goals - 02/13/18 1003      PT SHORT TERM GOAL #1   Title  indepednent with initial HEP    Time  2    Period  Weeks    Status  New        PT Long Term Goals - 02/13/18 1003      PT LONG TERM GOAL #1   Title  decrease pain 25%    Time  8    Period  Weeks    Status  New      PT LONG TERM GOAL #2   Title  increase hip flexion to 90 degrees    Time  8    Period  Weeks    Status  New      PT LONG TERM GOAL #3   Title  report get up from sitting without difficulty    Time  8    Period  Weeks    Status  New            Plan - 02/27/18 1147    Clinical Impression Statement  Pt did well with today's activities. She was able to complete all activities without increase pain or compensation. She does ambulate with a lateral lean to the R side. No pain post treatment.    Rehab Potential  Fair    PT Frequency  1x / week    PT Duration  8 weeks    PT Treatment/Interventions  ADLs/Self Care Home Management;Electrical Stimulation;Moist Heat;Ultrasound;Functional mobility training;Stair training;Therapeutic activities;Therapeutic exercise;Manual techniques;Patient/family education    PT Next Visit Plan  start working on flexibility of the hip and functional strength.  [redacted] weeks pregnant       Patient will benefit from skilled therapeutic intervention in order to improve the following deficits and impairments:  Abnormal gait, Decreased range of motion, Difficulty walking, Increased muscle spasms, Pain, Decreased activity tolerance, Impaired flexibility, Improper body mechanics, Decreased strength, Decreased mobility  Visit Diagnosis: Pain in left hip  Stiffness of left hip, not elsewhere  classified  Difficulty in walking, not elsewhere classified     Problem List Patient Active Problem List   Diagnosis Date Noted  . Supervision of other normal pregnancy, antepartum 12/05/2017  . Moderate persistent asthma 04/29/2015  . 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 Perennial and seasonal allergic rhinoconjunctivitis 04/29/2015    Grayce Sessions, PTA 02/27/2018, 11:52 AM  [facility] [place] [facility] [address] [place], [place], [address] Phone: [phone]   Fax:  [phone]  Name: Kim White MRN: [id number] Date of Birth: Dec 30, 1985

## 2018-03-05 ENCOUNTER — Encounter: Payer: Medicaid Other | Admitting: Physical Therapy

## 2018-03-06 ENCOUNTER — Ambulatory Visit: Payer: Medicaid Other | Admitting: Physical Therapy

## 2018-03-09 ENCOUNTER — Encounter: Payer: Medicaid Other | Admitting: Obstetrics

## 2018-03-14 NOTE — L&D Delivery Note (Signed)
Patient: Briasia Orie MRN: [id number]  GBS status: Positive, IAP given: Ampicillin   Patient is a 33 y.o. now G2P2 s/p NSVD at [redacted]w[redacted]d, who was admitted for SOL. SROM 2h 36m prior to delivery with clear fluid.    Delivery Note At 8:37 PM a viable female was delivered via Vaginal, Spontaneous (Presentation:OA).  APGAR: 6, 8; weight pending.   Placenta status: spontaneous, intact.  Cord: 3 vessel with the following complications: none.    Anesthesia:  None  Episiotomy: None Lacerations: None Suture Repair: None  Est. Blood Loss (mL): 100  Head delivered OA. No nuchal cord present. Shoulder and body delivered in usual fashion. Infant with spontaneous cry, placed on mother's abdomen, dried and bulb suctioned. Cord clamped x 2 after 1-minute delay, and cut by family member. Cord blood drawn. Placenta delivered spontaneously with gentle cord traction. Fundus firm with massage and Pitocin. Perineum inspected and found to have no lacerations.  Mom to postpartum.  Baby to Couplet care / Skin to Skin.  De Hollingshead 05/05/2018, 9:04 PM

## 2018-03-19 ENCOUNTER — Encounter: Payer: Medicaid Other | Admitting: Obstetrics

## 2018-03-29 ENCOUNTER — Encounter: Payer: Self-pay | Admitting: Obstetrics

## 2018-03-29 ENCOUNTER — Telehealth: Payer: Self-pay | Admitting: Obstetrics

## 2018-03-29 NOTE — Telephone Encounter (Signed)
Left voice message to Call Back and reschedule missed appointment. Also mailed letter.

## 2018-05-02 ENCOUNTER — Other Ambulatory Visit (HOSPITAL_COMMUNITY)
Admission: RE | Admit: 2018-05-02 | Discharge: 2018-05-02 | Disposition: A | Discharge status: Home / Self Care | Payer: Medicaid Other | Source: Ambulatory Visit | Attending: Obstetrics | Admitting: Obstetrics

## 2018-05-02 ENCOUNTER — Ambulatory Visit (INDEPENDENT_AMBULATORY_CARE_PROVIDER_SITE_OTHER): Payer: Medicaid Other | Admitting: Obstetrics

## 2018-05-02 ENCOUNTER — Encounter: Payer: Self-pay | Admitting: Obstetrics

## 2018-05-02 VITALS — BP 138/92 | HR 89 | Wt 234.7 lb

## 2018-05-02 DIAGNOSIS — Z3483 Encounter for supervision of other normal pregnancy, third trimester: Secondary | ICD-10-CM

## 2018-05-02 DIAGNOSIS — Z3480 Encounter for supervision of other normal pregnancy, unspecified trimester: Secondary | ICD-10-CM | POA: Insufficient documentation

## 2018-05-02 DIAGNOSIS — Z3A37 37 weeks gestation of pregnancy: Secondary | ICD-10-CM

## 2018-05-02 NOTE — Progress Notes (Signed)
Subjective:  Kim White is a 33 y.o. G2P1001 at [redacted]w[redacted]d being seen today for ongoing prenatal care.  She is currently monitored for the following issues for this low-risk pregnancy and has Moderate persistent asthma; Allergy with anaphylaxis due to food; Perennial and seasonal allergic rhinoconjunctivitis; and Supervision of other normal pregnancy, antepartum on their problem list.  Patient reports no complaints.  Contractions: Irregular. Vag. Bleeding: None.  Movement: Present. Denies leaking of fluid.   The following portions of the patient's history were reviewed and updated as appropriate: allergies, current medications, past family history, past medical history, past social history, past surgical history and problem list. Problem list updated.  Objective:   Vitals:   05/02/18 1051  BP: (!) 138/92  Pulse: 89  Weight: 234 lb 11.2 oz (106.5 kg)    Fetal Status: Fetal Heart Rate (bpm): 150   Movement: Present     General:  Alert, oriented and cooperative. Patient is in no acute distress.  Skin: Skin is warm and dry. No rash noted.   Cardiovascular: Normal heart rate noted  Respiratory: Normal respiratory effort, no problems with respiration noted  Abdomen: Soft, gravid, appropriate for gestational age. Pain/Pressure: Present     Pelvic:  Cervical exam deferred        Extremities: Normal range of motion.  Edema: None  Mental Status: Normal mood and affect. Normal behavior. Normal judgment and thought content.   Urinalysis:      Assessment and Plan:  Pregnancy: G2P1001 at [redacted]w[redacted]d  1. Supervision of other normal pregnancy, antepartum Rx: - Strep Gp B NAA - Cervicovaginal ancillary only( [place])  Term labor symptoms and general obstetric precautions including but not limited to vaginal bleeding, contractions, leaking of fluid and fetal movement were reviewed in detail with the patient. Please refer to After Visit Summary for other counseling recommendations.  Return in about  1 week (around 05/09/2018) for ROB.   Brock Bad, MDPt is here for ROB. [redacted]w[redacted]d.

## 2018-05-03 LAB — CERVICOVAGINAL ANCILLARY ONLY
BACTERIAL VAGINITIS: POSITIVE — AB
CANDIDA VAGINITIS: NEGATIVE
CHLAMYDIA, DNA PROBE: NEGATIVE
NEISSERIA GONORRHEA: NEGATIVE
TRICH (WINDOWPATH): NEGATIVE

## 2018-05-04 ENCOUNTER — Other Ambulatory Visit: Payer: Self-pay | Admitting: Obstetrics

## 2018-05-04 DIAGNOSIS — B9689 Other specified bacterial agents as the cause of diseases classified elsewhere: Secondary | ICD-10-CM

## 2018-05-04 DIAGNOSIS — N760 Acute vaginitis: Principal | ICD-10-CM

## 2018-05-04 LAB — STREP GP B NAA: Strep Gp B NAA: POSITIVE — AB

## 2018-05-04 MED ORDER — TINIDAZOLE 500 MG PO TABS: 1000.0000 mg | ORAL_TABLET | Freq: Every day | ORAL | 2 refills | Status: DC

## 2018-05-04 MED ORDER — METRONIDAZOLE 500 MG PO TABS: 500.0000 mg | ORAL_TABLET | Freq: Two times a day (BID) | ORAL | 2 refills | Status: DC

## 2018-05-05 ENCOUNTER — Inpatient Hospital Stay (HOSPITAL_COMMUNITY)
Admission: AD | Admit: 2018-05-05 | Discharge: 2018-05-07 | DRG: 807 | Disposition: A | Discharge status: Home / Self Care | Payer: Medicaid Other | Attending: Obstetrics & Gynecology | Admitting: Obstetrics & Gynecology

## 2018-05-05 ENCOUNTER — Encounter (HOSPITAL_COMMUNITY): Payer: Self-pay | Admitting: *Deleted

## 2018-05-05 DIAGNOSIS — J4540 Moderate persistent asthma, uncomplicated: Secondary | ICD-10-CM | POA: Diagnosis present

## 2018-05-05 DIAGNOSIS — O26893 Other specified pregnancy related conditions, third trimester: Secondary | ICD-10-CM | POA: Diagnosis present

## 2018-05-05 DIAGNOSIS — O9952 Diseases of the respiratory system complicating childbirth: Secondary | ICD-10-CM | POA: Diagnosis present

## 2018-05-05 DIAGNOSIS — O99824 Streptococcus B carrier state complicating childbirth: Secondary | ICD-10-CM | POA: Diagnosis not present

## 2018-05-05 DIAGNOSIS — Z3480 Encounter for supervision of other normal pregnancy, unspecified trimester: Secondary | ICD-10-CM

## 2018-05-05 DIAGNOSIS — Z3A38 38 weeks gestation of pregnancy: Secondary | ICD-10-CM

## 2018-05-05 LAB — COMPREHENSIVE METABOLIC PANEL
ALK PHOS: 168 U/L — AB (ref 38–126)
ALT: 13 U/L (ref 0–44)
AST: 19 U/L (ref 15–41)
Albumin: 3.1 g/dL — ABNORMAL LOW (ref 3.5–5.0)
Anion gap: 10 (ref 5–15)
BUN: 8 mg/dL (ref 6–20)
CALCIUM: 8.7 mg/dL — AB (ref 8.9–10.3)
CO2: 18 mmol/L — ABNORMAL LOW (ref 22–32)
Chloride: 108 mmol/L (ref 98–111)
Creatinine, Ser: 0.82 mg/dL (ref 0.44–1.00)
GFR calc Af Amer: >60 mL/min (ref >60)
GFR calc non Af Amer: >60 mL/min (ref >60)
Glucose, Bld: 87 mg/dL (ref 70–99)
Potassium: 3.7 mmol/L (ref 3.5–5.1)
Sodium: 136 mmol/L (ref 135–145)
TOTAL PROTEIN: 7.9 g/dL (ref 6.5–8.1)
Total Bilirubin: 0.5 mg/dL (ref 0.3–1.2)

## 2018-05-05 LAB — CBC
HEMATOCRIT: 38 % (ref 36.0–46.0)
HEMOGLOBIN: 12.7 g/dL (ref 12.0–15.0)
MCH: 27.2 pg (ref 26.0–34.0)
MCHC: 33.4 g/dL (ref 30.0–36.0)
MCV: 81.4 fL (ref 80.0–100.0)
Platelets: 183 10*3/uL (ref 150–400)
RBC: 4.67 MIL/uL (ref 3.87–5.11)
RDW: 13.9 % (ref 11.5–15.5)
WBC: 7.5 10*3/uL (ref 4.0–10.5)
nRBC: 0 % (ref 0.0–0.2)

## 2018-05-05 LAB — ABO/RH: ABO/RH(D): B POS

## 2018-05-05 LAB — TYPE AND SCREEN
ABO/RH(D): B POS
Antibody Screen: NEGATIVE

## 2018-05-05 MED ORDER — ACETAMINOPHEN 325 MG PO TABS: 650.0000 mg | ORAL_TABLET | ORAL | Status: DC | PRN

## 2018-05-05 MED ORDER — LACTATED RINGERS IV SOLN
INTRAVENOUS | Status: DC
  Administered 2018-05-05: 18:00:00 via INTRAVENOUS

## 2018-05-05 MED ORDER — PHENYLEPHRINE 40 MCG/ML (10ML) SYRINGE FOR IV PUSH (FOR BLOOD PRESSURE SUPPORT)
PREFILLED_SYRINGE | INTRAVENOUS | Status: AC
  Filled 2018-05-05: qty 10

## 2018-05-05 MED ORDER — SOD CITRATE-CITRIC ACID 500-334 MG/5ML PO SOLN
30.0000 mL | ORAL | Status: DC | PRN
  Filled 2018-05-05: qty 30

## 2018-05-05 MED ORDER — DIPHENHYDRAMINE HCL 25 MG PO CAPS: 25.0000 mg | ORAL_CAPSULE | Freq: Four times a day (QID) | ORAL | Status: DC | PRN

## 2018-05-05 MED ORDER — FLEET ENEMA 7-19 GM/118ML RE ENEM: 1.0000 | ENEMA | RECTAL | Status: DC | PRN

## 2018-05-05 MED ORDER — LIDOCAINE HCL (PF) 1 % IJ SOLN
30.0000 mL | INTRAMUSCULAR | Status: DC | PRN
  Filled 2018-05-05: qty 30

## 2018-05-05 MED ORDER — COCONUT OIL OIL: 1.0000 "application " | TOPICAL_OIL | Status: DC | PRN

## 2018-05-05 MED ORDER — EPHEDRINE 5 MG/ML INJ
10.0000 mg | INTRAVENOUS | Status: DC | PRN
  Filled 2018-05-05: qty 2

## 2018-05-05 MED ORDER — MEASLES, MUMPS & RUBELLA VAC IJ SOLR
0.5000 mL | Freq: Once | INTRAMUSCULAR | Status: DC
  Filled 2018-05-05: qty 0.5

## 2018-05-05 MED ORDER — BENZOCAINE-MENTHOL 20-0.5 % EX AERO
1.0000 "application " | INHALATION_SPRAY | CUTANEOUS | Status: DC | PRN
  Administered 2018-05-05: 1 via TOPICAL
  Filled 2018-05-05: qty 56

## 2018-05-05 MED ORDER — ZOLPIDEM TARTRATE 5 MG PO TABS: 5.0000 mg | ORAL_TABLET | Freq: Every evening | ORAL | Status: DC | PRN

## 2018-05-05 MED ORDER — DIBUCAINE 1 % RE OINT: 1.0000 "application " | TOPICAL_OINTMENT | RECTAL | Status: DC | PRN

## 2018-05-05 MED ORDER — FENTANYL-BUPIVACAINE-NACL 0.5-0.125-0.9 MG/250ML-% EP SOLN: 12.0000 mL/h | EPIDURAL | Status: DC | PRN

## 2018-05-05 MED ORDER — PHENYLEPHRINE 40 MCG/ML (10ML) SYRINGE FOR IV PUSH (FOR BLOOD PRESSURE SUPPORT)
80.0000 ug | PREFILLED_SYRINGE | INTRAVENOUS | Status: DC | PRN
  Filled 2018-05-05: qty 10

## 2018-05-05 MED ORDER — ONDANSETRON HCL 4 MG PO TABS: 4.0000 mg | ORAL_TABLET | ORAL | Status: DC | PRN

## 2018-05-05 MED ORDER — WITCH HAZEL-GLYCERIN EX PADS: 1.0000 "application " | MEDICATED_PAD | CUTANEOUS | Status: DC | PRN

## 2018-05-05 MED ORDER — OXYCODONE-ACETAMINOPHEN 5-325 MG PO TABS: 2.0000 | ORAL_TABLET | ORAL | Status: DC | PRN

## 2018-05-05 MED ORDER — LACTATED RINGERS IV SOLN: 500.0000 mL | Freq: Once | INTRAVENOUS | Status: DC

## 2018-05-05 MED ORDER — IBUPROFEN 600 MG PO TABS
600.0000 mg | ORAL_TABLET | Freq: Four times a day (QID) | ORAL | Status: DC
  Administered 2018-05-05 – 2018-05-07 (×7): 600 mg via ORAL
  Filled 2018-05-05 (×7): qty 1

## 2018-05-05 MED ORDER — OXYTOCIN 40 UNITS IN NORMAL SALINE INFUSION - SIMPLE MED
2.5000 [IU]/h | INTRAVENOUS | Status: DC
  Filled 2018-05-05: qty 1000

## 2018-05-05 MED ORDER — ONDANSETRON HCL 4 MG/2ML IJ SOLN: 4.0000 mg | INTRAMUSCULAR | Status: DC | PRN

## 2018-05-05 MED ORDER — FENTANYL CITRATE (PF) 100 MCG/2ML IJ SOLN
50.0000 ug | INTRAMUSCULAR | Status: DC | PRN
  Administered 2018-05-05 (×2): 50 ug via INTRAVENOUS
  Filled 2018-05-05: qty 2

## 2018-05-05 MED ORDER — DIPHENHYDRAMINE HCL 50 MG/ML IJ SOLN: 12.5000 mg | INTRAMUSCULAR | Status: DC | PRN

## 2018-05-05 MED ORDER — SIMETHICONE 80 MG PO CHEW: 80.0000 mg | CHEWABLE_TABLET | ORAL | Status: DC | PRN

## 2018-05-05 MED ORDER — FENTANYL CITRATE (PF) 100 MCG/2ML IJ SOLN
100.0000 ug | INTRAMUSCULAR | Status: DC | PRN
  Filled 2018-05-05: qty 2

## 2018-05-05 MED ORDER — LACTATED RINGERS IV SOLN: 500.0000 mL | INTRAVENOUS | Status: DC | PRN

## 2018-05-05 MED ORDER — FENTANYL-BUPIVACAINE-NACL 0.5-0.125-0.9 MG/250ML-% EP SOLN
EPIDURAL | Status: AC
  Filled 2018-05-05: qty 250

## 2018-05-05 MED ORDER — SENNOSIDES-DOCUSATE SODIUM 8.6-50 MG PO TABS
2.0000 | ORAL_TABLET | ORAL | Status: DC
  Administered 2018-05-05 – 2018-05-07 (×2): 2 via ORAL
  Filled 2018-05-05 (×2): qty 2

## 2018-05-05 MED ORDER — SODIUM CHLORIDE 0.9 % IV SOLN
2.0000 g | Freq: Once | INTRAVENOUS | Status: AC
  Administered 2018-05-05: 2 g via INTRAVENOUS
  Filled 2018-05-05: qty 2

## 2018-05-05 MED ORDER — TETANUS-DIPHTH-ACELL PERTUSSIS 5-2.5-18.5 LF-MCG/0.5 IM SUSP
0.5000 mL | Freq: Once | INTRAMUSCULAR | Status: AC
  Administered 2018-05-07: 0.5 mL via INTRAMUSCULAR
  Filled 2018-05-05: qty 0.5

## 2018-05-05 MED ORDER — OXYTOCIN BOLUS FROM INFUSION
500.0000 mL | Freq: Once | INTRAVENOUS | Status: AC
  Administered 2018-05-05: 500 mL via INTRAVENOUS

## 2018-05-05 MED ORDER — ONDANSETRON HCL 4 MG/2ML IJ SOLN: 4.0000 mg | Freq: Four times a day (QID) | INTRAMUSCULAR | Status: DC | PRN

## 2018-05-05 MED ORDER — PRENATAL MULTIVITAMIN CH
1.0000 | ORAL_TABLET | Freq: Every day | ORAL | Status: DC
  Administered 2018-05-06 – 2018-05-07 (×2): 1 via ORAL
  Filled 2018-05-05 (×2): qty 1

## 2018-05-05 MED ORDER — OXYCODONE-ACETAMINOPHEN 5-325 MG PO TABS: 1.0000 | ORAL_TABLET | ORAL | Status: DC | PRN

## 2018-05-05 NOTE — MAU Note (Signed)
Kim White is a 33 y.o. at [redacted]w[redacted]d here in MAU reporting: contractions since this AM and are coming every 3 min. No bleeding, no LOF, feeling some movement but unsure due to ctx  Onset of complaint: this AM  Pain score: 10/10  Vitals:   05/05/18 1749  BP: (!) 133/99  Pulse: 99  Resp: 18  Temp: 98.1 F (36.7 C)  SpO2: 100%      FHT:141  Lab orders placed from triage: none

## 2018-05-05 NOTE — H&P (Signed)
OBSTETRIC ADMISSION HISTORY AND PHYSICAL  Kim White is a 33 y.o. female G2P1001 with IUP at [redacted]w[redacted]d by early u/s presenting for spontaneous onset of labor. She reports +FMs, No LOF, no VB, no blurry vision, headaches or peripheral edema, and RUQ pain.  She plans on breast feeding. She is unsure what she wants for birth control. She received her prenatal care at [facility] Staff Provider  Office Location  [facility] Dating   u/s   Language   English Anatomy US  Normal 20 weeks  Flu Vaccine   Decline 9/24 Genetic Screen  NIPS:  Low risk  AFP: low risk  First Screen:  Quad: negative   TDaP vaccine    Hgb A1C or  GTT Early  Third trimester   Rhogam     LAB RESULTS   Feeding Plan breast Blood Type B/Positive/-- (09/24 1425)   Contraception unsure Antibody Negative (09/24 1425)  Circumcision   Rubella 1.95 (09/24 1425)  Pediatrician   RPR Non Reactive (09/24 1425)   Support Person  HBsAg Negative (09/24 1425)   Prenatal Classes  HIV Non Reactive (09/24 1425)  BTL Consent  GBS  (For PCN allergy, check sensitivities)   VBAC Consent  Pap 05/2017 negative    Hgb Electro   negative    CF No variant detected    SMA     Waterbirth  [ ]  Class [ ]  Consent [ ]  CNM visit   Prenatal History/Complications:  Past Medical History: Past Medical History:  Diagnosis Date  . Allergy   . Seasonal asthma     Past Surgical History: Past Surgical History:  Procedure Laterality Date  . child birth  05/05/2011  . HERNIA REPAIR      Obstetrical History: OB History    Gravida  2   Para  1   Term  1   Preterm      AB      Living  1     SAB      TAB      Ectopic      Multiple      Live Births  1           Social History: Social History   Socioeconomic History  . Marital status: Married    Spouse name: Not on file  . Number of children: 2  . Years of education: HS  . Highest education level: Not on file  Occupational History  . Not on file  Social Needs  .  Financial resource strain: Not on file  . Food insecurity:    Worry: Not on file    Inability: Not on file  . Transportation needs:    Medical: Not on file    Non-medical: Not on file  Tobacco Use  . Smoking status: Never Smoker  . Smokeless tobacco: Never Used  Substance and Sexual Activity  . Alcohol use: No    Alcohol/week: 0.0 standard drinks  . Drug use: No  . Sexual activity: Yes    Birth control/protection: None    Comment: married, 2 boys  Lifestyle  . Physical activity:    Days per week: Not on file    Minutes per session: Not on file  . Stress: Not on file  Relationships  . Social connections:    Talks on phone: Not on file    Gets together: Not on file    Attends religious service: Not on file    Active member of  club or organization: Not on file    Attends meetings of clubs or organizations: Not on file    Relationship status: Not on file  Other Topics Concern  . Not on file  Social History Narrative   Denies caffeine use     Family History: Family History  Problem Relation Age of Onset  . Asthma Brother   . Hyperlipidemia Maternal Grandmother   . Cancer Maternal Grandmother        Breast  . Arthritis Mother   . Hearing loss Mother   . Miscarriages / [place] Mother   . Hyperlipidemia Maternal Grandfather   . Heart disease Maternal Grandfather        in his 28's  . Alcohol abuse Paternal Grandmother   . Stroke Father        suspected from substance  . Hypertension Father   . Cancer Other   . Allergic rhinitis Neg Hx   . Eczema Neg Hx   . Immunodeficiency Neg Hx   . Urticaria Neg Hx     Allergies: Allergies  Allergen Reactions  . Other Nausea And Vomiting    ALL TREE NUTS  . Shellfish Allergy Swelling    Medications Prior to Admission  Medication Sig Dispense Refill Last Dose  . albuterol (PROAIR HFA) 108 (90 Base) MCG/ACT inhaler Inhale 2 puffs into the lungs every 4 (four) hours as needed for wheezing or shortness of breath  (cough). Reported on 05/11/2015   Taking  . Elastic Bandages & Supports (COMFORT FIT MATERNITY SUPP SM) MISC Wear as directed. 1 each 0 Taking  . EPINEPHrine (EPIPEN 2-PAK) 0.3 mg/0.3 mL IJ SOAJ injection Inject 0.3 mg into the muscle once. Reported on 05/11/2015   Taking  . metroNIDAZOLE (FLAGYL) 500 MG tablet Take 1 tablet (500 mg total) by mouth 2 (two) times daily. 14 tablet 2   . mometasone-formoterol (DULERA) 200-5 MCG/ACT AERO Inhale 2 puffs into the lungs 2 (two) times daily. (Patient not taking: Reported on 12/05/2017) 1 Inhaler 2 Not Taking  . Prenatal Vit-Fe Fumarate-FA (PRENATAL MULTIVITAMIN) TABS tablet Take 1 tablet by mouth daily at 12 noon.   Taking  . tinidazole (TINDAMAX) 500 MG tablet Take 2 tablets (1,000 mg total) by mouth daily with breakfast. 10 tablet 2      Review of Systems   All systems reviewed and negative except as stated in HPI  Blood pressure (!) 133/99, pulse 99, temperature 98.1 F (36.7 C), temperature source Oral, resp. rate 18, height  (1.575 m), weight 106.6 kg, SpO2 100 %. General appearance: alert and cooperative Lungs: clear to auscultation bilaterally Heart: regular rate and rhythm Abdomen: soft, non-tender; bowel sounds normal Pelvic: n/a Extremities: Homans sign is negative, no sign of DVT DTR's +2 Presentation: cephalic Fetal monitoringBaseline: 130 bpm, Variability: Good {> 6 bpm), Accelerations: Reactive and Decelerations: variable Uterine activityFrequency: Every 3-5 minutes     Prenatal labs: ABO, Rh: B/Positive/-- (09/24 1425) Antibody: Negative (09/24 1425) Rubella: 1.95 (09/24 1425) RPR: Non Reactive (12/13 1025)  HBsAg: Negative (09/24 1425)  HIV: Non Reactive (12/13 1025)  GBS: Positive (02/19 1110)   Prenatal Transfer Tool  Maternal Diabetes: No Genetic Screening: Normal Maternal Ultrasounds/Referrals: Normal Fetal Ultrasounds or other Referrals:  None Maternal Substance Abuse:  No Significant Maternal Medications:   None Significant Maternal Lab Results: Lab values include: Group B Strep positive  No results found for this or any previous visit (from the past 24 hour(s)).  Patient Active Problem List   Diagnosis  Date Noted  . Normal labor 05/05/2018  . Supervision of other normal pregnancy, antepartum 12/05/2017  . Moderate persistent asthma 04/29/2015  . 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 Perennial and seasonal allergic rhinoconjunctivitis 04/29/2015    Assessment/Plan:  Ahslee Guidroz is a 33 y.o. G2P1001 at [redacted]w[redacted]d here for spontaneous onset of labor  #Labor: Expectant management. Will consider AROM if no progress #Pain: Per patient request #FWB: Cat 1 #ID:  GBS pos- Ampicillin ordered #MOF: breast #MOC: unsure #Circ:  outpatient  Rolm Bookbinder, CNM  05/05/2018, 6:11 PM

## 2018-05-05 NOTE — H&P (Signed)
Kim White is a 33 y.o. female G2P1001 @[redacted]w[redacted]d  pt of [facility] presenting for active labor at term.  She feels normal fetal movement and denies leakage of fluid or vaginal bleeding.   Nursing Staff Provider  Office Location  [facility] Dating   u/s   Language   English Anatomy US  Normal 20 weeks  Flu Vaccine   Decline 9/24 Genetic Screen  NIPS:  Low risk  AFP: low risk  First Screen:  Quad: negative   TDaP vaccine    Hgb A1C or  GTT Early  Third trimester   Rhogam     LAB RESULTS   Feeding Plan breast Blood Type B/Positive/-- (09/24 1425)   Contraception unsure Antibody Negative (09/24 1425)  Circumcision   Rubella 1.95 (09/24 1425)  Pediatrician   RPR Non Reactive (09/24 1425)   Support Person  HBsAg Negative (09/24 1425)   Prenatal Classes  HIV Non Reactive (09/24 1425)  BTL Consent  GBS  (For PCN allergy, check sensitivities)   VBAC Consent  Pap 05/2017 negative    Hgb Electro   negative    CF No variant detected    SMA     Waterbirth  [ ]  Class [ ]  Consent [ ]  CNM visit    OB History    Gravida  2   Para  1   Term  1   Preterm      AB      Living  1     SAB      TAB      Ectopic      Multiple      Live Births  1          Past Medical History:  Diagnosis Date  . Allergy   . Seasonal asthma    Past Surgical History:  Procedure Laterality Date  . child birth  05/05/2011  . HERNIA REPAIR     Family History: family history includes Alcohol abuse in her paternal grandmother; Arthritis in her mother; Asthma in her brother; Cancer in her maternal grandmother and another family member; Hearing loss in her mother; Heart disease in her maternal grandfather; Hyperlipidemia in her maternal grandfather and maternal grandmother; Hypertension in her father; Miscarriages / [place] in her mother; Stroke in her father. Social History:  reports that she has never smoked. She has never used smokeless tobacco. She reports that she does not drink alcohol or use  drugs.     Maternal Diabetes: No Genetic Screening: Normal Maternal Ultrasounds/Referrals: Normal Fetal Ultrasounds or other Referrals:  None Maternal Substance Abuse:  No Significant Maternal Medications:  None Significant Maternal Lab Results:  Lab values include: Group B Strep positive Other Comments:  None  Review of Systems  Constitutional: Negative for chills and fever.  Respiratory: Negative for shortness of breath.   Cardiovascular: Negative for chest pain.  Gastrointestinal: Positive for abdominal pain. Negative for constipation, diarrhea, nausea and vomiting.  Neurological: Negative for dizziness and headaches.  All other systems reviewed and are negative.  Maternal Medical History:  Reason for admission: Contractions.  Nausea.  Contractions: Onset was 3-5 hours ago.   Frequency: regular.   Perceived severity is strong.    Fetal activity: Perceived fetal activity is normal.   Last perceived fetal movement was within the past hour.    Prenatal complications: no prenatal complications Prenatal Complications - Diabetes: none.      Blood pressure (!) 133/99, pulse 99, temperature 98.1 F (36.7 C), temperature source Oral,  resp. rate 18, height 5\' 2"  (1.575 m), weight 106.6 kg, SpO2 100 %. Maternal Exam:  Uterine Assessment: Contraction strength is firm.  Contraction frequency is regular.   Abdomen: Fetal presentation: vertex  Cervix: Cervix evaluated by digital exam.     Fetal Exam Fetal Monitor Review: Mode: ultrasound.   Baseline rate: 135.  Variability: moderate (6-25 bpm).   Pattern: accelerations present and variable decelerations.    Fetal State Assessment: Category II - tracings are indeterminate. Isolated variables, overall Category I FHR tracing  Physical Exam  Nursing note and vitals reviewed. Constitutional: She is oriented to person, place, and time. She appears well-developed and well-nourished.  Neck: Normal range of motion.   Cardiovascular: Normal rate.  Respiratory: Effort normal.  GI: Soft.  Musculoskeletal: Normal range of motion.  Neurological: She is alert and oriented to person, place, and time.  Skin: Skin is warm and dry.  Psychiatric: She has a normal mood and affect. Her behavior is normal. Judgment and thought content normal.    Prenatal labs: ABO, Rh: B/Positive/-- (09/24 1425) Antibody: Negative (09/24 1425) Rubella: 1.95 (09/24 1425) RPR: Non Reactive (12/13 1025)  HBsAg: Negative (09/24 1425)  HIV: Non Reactive (12/13 1025)  GBS: Positive (02/19 1110)   Assessment/Plan: G2P1001 @[redacted]w[redacted]d  in active labor at term GBS positive  Admit to [facility] for GBS prophylaxis IV pain medication at pt request Anticipate NSVD   Sharen Counter 05/05/2018, 6:08 PM

## 2018-05-06 LAB — RPR: RPR Ser Ql: NONREACTIVE

## 2018-05-06 LAB — TYPE AND SCREEN
ABO/RH(D): B POS
Antibody Screen: NEGATIVE

## 2018-05-06 LAB — ABO/RH: ABO/RH(D): B POS

## 2018-05-06 NOTE — Lactation Note (Signed)
This note was copied from a baby's chart. Lactation Consultation Note  Patient Name: Kim White RCVEL'F Date: 05/06/2018   Follow up visit.  Baby Kim Magnone now 31 hours old.  Mom reports he is breastfeeding better.  Still prefers one breast over other but they are working on it. Offered to assist with feeding.  Mom declines LC assist at this time.  Urged mom to call for lactation services as needed. Praised breastfeeding.  Maternal Data    Feeding Feeding Type: Breast Fed  LATCH Score                   Interventions    Lactation Tools Discussed/Used     Consult Status      Kim White 05/06/2018, 5:00 PM

## 2018-05-06 NOTE — Progress Notes (Signed)
Post Partum Day 1 Subjective: up ad lib, voiding and tolerating PO  Objective: Blood pressure 110/71, pulse 66, temperature 97.7 F (36.5 C), temperature source Axillary, resp. rate 18, height 5\' 2"  (1.575 m), weight 106.6 kg, SpO2 98 %, unknown if currently breastfeeding.  Physical Exam:  General: alert, cooperative and no distress Lochia: appropriate Uterine Fundus: firm Incision: n/a DVT Evaluation: No evidence of DVT seen on physical exam.  Recent Labs    05/05/18 1857  HGB 12.7  HCT 38.0    Assessment/Plan: Plan for discharge tomorrow, Breastfeeding and Contraception vasectomy   LOS: 1 day   Kim White 05/06/2018, 6:18 AM

## 2018-05-06 NOTE — Lactation Note (Signed)
This note was copied from a baby's chart. Lactation Consultation Note  Patient Name: Kim White OVANV'B Date: 05/06/2018 Reason for consult: Initial assessment   P2, Baby 10 hours old.  Mother is Ex BF for 2 years. Reviewed hand expression with drops expressed on R side while baby latched on L side. Turned baby tummy to tummy.  Noted intermittent swallows. Praised mother for her efforts. Feed on demand approximately 8-12 times per day.   Mom made aware of O/P services, breastfeeding support groups, community resources, and our phone # for post-discharge questions.     Maternal Data Has patient been taught Hand Expression?: Yes Does the patient have breastfeeding experience prior to this delivery?: Yes  Feeding Feeding Type: Breast Fed  LATCH Score Latch: Grasps breast easily, tongue down, lips flanged, rhythmical sucking.(latched upon entering)  Audible Swallowing: A few with stimulation  Type of Nipple: Everted at rest and after stimulation  Comfort (Breast/Nipple): Soft / non-tender  Hold (Positioning): Assistance needed to correctly position infant at breast and maintain latch.  LATCH Score: 8  Interventions Interventions: Breast feeding basics reviewed;Hand express  Lactation Tools Discussed/Used     Consult Status Consult Status: Follow-up Date: 05/07/18    [name] [facility] 05/06/2018, 7:32 AM

## 2018-05-06 NOTE — Progress Notes (Signed)
Initial visit with Sigurd Sos, FOB, and baby Amari.  Family reports they are settling in well and feeling good after delivery of baby and transport to [facility].  FOB requested a blanket and I brought him linens for the in room sofa.    Please page as further needs arise.  Maryanna Shape. Carley Hammed, M.Div. [facility] Chaplain Pager [phone] Office [phone]

## 2018-05-07 MED ORDER — IBUPROFEN 600 MG PO TABS: 600.0000 mg | ORAL_TABLET | Freq: Four times a day (QID) | ORAL | 0 refills | Status: DC

## 2018-05-07 MED ORDER — TETANUS-DIPHTH-ACELL PERTUSSIS 5-2.5-18.5 LF-MCG/0.5 IM SUSP: 0.5000 mL | Freq: Once | INTRAMUSCULAR | 0 refills | Status: AC

## 2018-05-07 MED ORDER — SENNOSIDES-DOCUSATE SODIUM 8.6-50 MG PO TABS: 2.0000 | ORAL_TABLET | ORAL | 0 refills | Status: DC

## 2018-05-07 NOTE — Discharge Summary (Addendum)
Postpartum Discharge Summary     Patient Name: Kim White DOB: Aug 23, 1985 MRN: [id number]  Date of admission: 05/05/2018 Delivering Provider: Arvilla Market   Date of discharge: 05/07/2018  Admitting diagnosis: 39 WKS, CTXS Intrauterine pregnancy: [redacted]w[redacted]d     Secondary diagnosis:  Active Problems:   Normal labor  Additional problems: None     Discharge diagnosis: Term Pregnancy Delivered                                                                                                Post partum procedures:None  Augmentation: None  Complications: None  Hospital course:  Onset of Labor With Vaginal Delivery     33 y.o. yo [id number] at [redacted]w[redacted]d was admitted in Active Labor on 05/05/2018. Patient had an uncomplicated labor course as follows:  Membrane Rupture Time/Date: 6:15 PM ,05/05/2018   Intrapartum Procedures: Episiotomy: None [1]                                         Lacerations:  None [1]  Patient had a delivery of a Viable infant. 05/05/2018  Information for the patient's newborn:  Kaylyne, Koogler [[id number]]  Delivery Method: Vaginal, Spontaneous(Filed from Delivery Summary)    Pateint had an uncomplicated postpartum course.  She is ambulating, tolerating a regular diet, passing flatus, and urinating well. Patient is discharged home in stable condition on 05/07/18.   Magnesium Sulfate recieved: No BMZ received: No  Physical exam  Vitals:   05/06/18 1030 05/06/18 1700 05/06/18 2206 05/07/18 0615  BP: 121/89 126/84 127/79 116/65  Pulse: (!) 53 (!) 59 71 (!) 56  Resp: 18 18 16 17   Temp: 98.2 F (36.8 C) 97.6 F (36.4 C) 98.1 F (36.7 C) 98.1 F (36.7 C)  TempSrc: Oral Oral Oral Oral  SpO2:   99%   Weight:      Height:       General: alert, cooperative and no distress Lochia: appropriate Uterine Fundus: firm Incision: N/A DVT Evaluation: No evidence of DVT seen on physical exam. Labs: Lab Results  Component Value Date   WBC 7.5 05/05/2018    HGB 12.7 05/05/2018   HCT 38.0 05/05/2018   MCV 81.4 05/05/2018   PLT 183 05/05/2018   CMP Latest Ref Rng & Units 05/05/2018  Glucose 70 - 99 mg/dL 87  BUN 6 - 20 mg/dL 8  Creatinine 1.30 - 8.65 mg/dL 7.84  Sodium 696 - 295 mmol/L 136  Potassium 3.5 - 5.1 mmol/L 3.7  Chloride 98 - 111 mmol/L 108  CO2 22 - 32 mmol/L 18(L)  Calcium 8.9 - 10.3 mg/dL 2.8(U)  Total Protein 6.5 - 8.1 g/dL 7.9  Total Bilirubin 0.3 - 1.2 mg/dL 0.5  Alkaline Phos 38 - 126 U/L 168(H)  AST 15 - 41 U/L 19  ALT 0 - 44 U/L 13    Discharge instruction: per After Visit Summary and "Baby and Me Booklet".  After visit meds:  Allergies as of 05/07/2018  Reactions   Other Nausea And Vomiting   ALL TREE NUTS   Shellfish Allergy Swelling      Medication List    STOP taking these medications   metroNIDAZOLE 500 MG tablet Commonly known as:  FLAGYL   mometasone-formoterol 200-5 MCG/ACT Aero Commonly known as:  DULERA   tinidazole 500 MG tablet Commonly known as:  TINDAMAX     TAKE these medications   COMFORT FIT MATERNITY SUPP SM Misc Wear as directed.   EPIPEN 2-PAK 0.3 mg/0.3 mL Soaj injection Generic drug:  EPINEPHrine Inject 0.3 mg into the muscle once. Reported on 05/11/2015   ibuprofen 600 MG tablet Commonly known as:  ADVIL,MOTRIN Take 1 tablet (600 mg total) by mouth 4 (four) times daily.   prenatal multivitamin Tabs tablet Take 1 tablet by mouth daily at 12 noon.   PROAIR HFA 108 (90 Base) MCG/ACT inhaler Generic drug:  albuterol Inhale 2 puffs into the lungs every 4 (four) hours as needed for wheezing or shortness of breath (cough). Reported on 05/11/2015   senna-docusate 8.6-50 MG tablet Commonly known as:  Senokot-S Take 2 tablets by mouth daily. Start taking on:  May 08, 2018   Tdap 5-2.5-18.5 LF-MCG/0.5 injection Commonly known as:  BOOSTRIX Inject 0.5 mLs into the muscle once for 1 dose.       Diet: routine diet  Activity: Advance as tolerated. Pelvic rest  for 6 weeks.   Outpatient follow up:4 weeks Follow up Appt: Future Appointments  Date Time Provider Department Center  05/09/2018  9:00 AM Brock Bad, MD [facility] None  06/05/2018  9:00 AM Brock Bad, MD [facility] None   Follow up Visit: Please schedule this patient for Postpartum visit in: 4 weeks with the following provider: Any provider For C/S patients schedule nurse incision check in weeks 2 weeks: N/A Low risk pregnancy complicated by: None Delivery mode:  SVD Anticipated Birth Control:  Significant other to get Vasectomy PP Procedures needed: None  Schedule Integrated BH visit: no   Newborn Data: Live born female  Birth Weight: 6 lb 13.5 oz (3104 g) APGAR: 6, 8  Newborn Delivery   Birth date/time:  05/05/2018 20:37:00 Delivery type:  Vaginal, Spontaneous     Baby Feeding: Breast Disposition:home with mother   05/07/2018 Kim Reamer, DO  OB FELLOW DISCHARGE ATTESTATION  I have seen and examined this patient and agree with above documentation in the resident's note.   Marcy Siren, D.O. OB Fellow  05/07/2018, 11:00 AM

## 2018-05-07 NOTE — Discharge Instructions (Signed)
Vaginal Delivery, Care After °Refer to this sheet in the next few weeks. These instructions provide you with information about caring for yourself after vaginal delivery. Your health care provider may also give you more specific instructions. Your treatment has been planned according to current medical practices, but problems sometimes occur. Call your health care provider if you have any problems or questions. °What can I expect after the procedure? °After vaginal delivery, it is common to have: °· Some bleeding from your vagina. °· Soreness in your abdomen, your vagina, and the area of skin between your vaginal opening and your anus (perineum). °· Pelvic cramps. °· Fatigue. °Follow these instructions at home: °Medicines °· Take over-the-counter and prescription medicines only as told by your health care provider. °· If you were prescribed an antibiotic medicine, take it as told by your health care provider. Do not stop taking the antibiotic until it is finished. °Driving ° °· Do not drive or operate heavy machinery while taking prescription pain medicine. °· Do not drive for 24 hours if you received a sedative. °Lifestyle °· Do not drink alcohol. This is especially important if you are breastfeeding or taking medicine to relieve pain. °· Do not use tobacco products, including cigarettes, chewing tobacco, or e-cigarettes. If you need help quitting, ask your health care provider. °Eating and drinking °· Drink at least 8 eight-ounce glasses of water every day unless you are told not to by your health care provider. If you choose to breastfeed your baby, you may need to drink more water than this. °· Eat high-fiber foods every day. These foods may help prevent or relieve constipation. High-fiber foods include: °? Whole grain cereals and breads. °? Brown rice. °? Beans. °? Fresh fruits and vegetables. °Activity °· Return to your normal activities as told by your health care provider. Ask your health care provider what  activities are safe for you. °· Rest as much as possible. Try to rest or take a nap when your baby is sleeping. °· Do not lift anything that is heavier than your baby or 10 lb (4.5 kg) until your health care provider says that it is safe. °· Talk with your health care provider about when you can engage in sexual activity. This may depend on your: °? Risk of infection. °? Rate of healing. °? Comfort and desire to engage in sexual activity. °Vaginal Care °· If you have an episiotomy or a vaginal tear, check the area every day for signs of infection. Check for: °? More redness, swelling, or pain. °? More fluid or blood. °? Warmth. °? Pus or a bad smell. °· Do not use tampons or douches until your health care provider says this is safe. °· Watch for any blood clots that may pass from your vagina. These may look like clumps of dark red, brown, or black discharge. °General instructions °· Keep your perineum clean and dry as told by your health care provider. °· Wear loose, comfortable clothing. °· Wipe from front to back when you use the toilet. °· Ask your health care provider if you can shower or take a bath. If you had an episiotomy or a perineal tear during labor and delivery, your health care provider may tell you not to take baths for a certain length of time. °· Wear a bra that supports your breasts and fits you well. °· If possible, have someone help you with household activities and help care for your baby for at least a few days after you   leave the hospital. °· Keep all follow-up visits for you and your baby as told by your health care provider. This is important. °Contact a health care provider if: °· You have: °? Vaginal discharge that has a bad smell. °? Difficulty urinating. °? Pain when urinating. °? A sudden increase or decrease in the frequency of your bowel movements. °? More redness, swelling, or pain around your episiotomy or vaginal tear. °? More fluid or blood coming from your episiotomy or vaginal  tear. °? Pus or a bad smell coming from your episiotomy or vaginal tear. °? A fever. °? A rash. °? Little or no interest in activities you used to enjoy. °? Questions about caring for yourself or your baby. °· Your episiotomy or vaginal tear feels warm to the touch. °· Your episiotomy or vaginal tear is separating or does not appear to be healing. °· Your breasts are painful, hard, or turn red. °· You feel unusually sad or worried. °· You feel nauseous or you vomit. °· You pass large blood clots from your vagina. If you pass a blood clot from your vagina, save it to show to your health care provider. Do not flush blood clots down the toilet without having your health care provider look at them. °· You urinate more than usual. °· You are dizzy or light-headed. °· You have not breastfed at all and you have not had a menstrual period for 12 weeks after delivery. °· You have stopped breastfeeding and you have not had a menstrual period for 12 weeks after you stopped breastfeeding. °Get help right away if: °· You have: °? Pain that does not go away or does not get better with medicine. °? Chest pain. °? Difficulty breathing. °? Blurred vision or spots in your vision. °? Thoughts about hurting yourself or your baby. °· You develop pain in your abdomen or in one of your legs. °· You develop a severe headache. °· You faint. °· You bleed from your vagina so much that you fill two sanitary pads in one hour. °This information is not intended to replace advice given to you by your health care provider. Make sure you discuss any questions you have with your health care provider. °Document Released: 02/26/2000 Document Revised: 08/12/2015 Document Reviewed: 03/15/2015 °Elsevier Interactive Patient Education © 2019 [employer] Inc. ° °

## 2018-05-07 NOTE — Lactation Note (Signed)
This note was copied from a baby's chart. Lactation Consultation Note  Patient Name: Kim White MGNOI'B Date: 05/07/2018 Reason for consult: Follow-up assessment   Baby 38 hours old.  Ex BF denies questions or problems and states breastfeeding is going well. Feed on demand approximately 8-12 times per day.   Suggest mother call if she needs assistance.    Maternal Data    Feeding Feeding Type: Breast Fed  LATCH Score                   Interventions    Lactation Tools Discussed/Used     Consult Status Consult Status: PRN Date: 05/07/18 Follow-up type: In-patient    [name] [facility] 05/07/2018, 10:39 AM

## 2018-05-08 ENCOUNTER — Ambulatory Visit: Payer: Self-pay

## 2018-05-08 NOTE — Lactation Note (Addendum)
This note was copied from a baby's chart. Lactation Consultation Note:  Infant is 61 hours old and is at 9 % weight loss.  Mother reports that she just latched infant. She reports that she is having a  difficult time getting him to open his mouth wide.  Assist mother with flanging infants lips and adjusting infants chin for wider gape. Mothers position is poor with no support and infant dangling from breast.   Infant sustained latch for 18 mins. Mother taught to do breast compression and observed good burst of suckling and frequent swallows.   Mother has bilateral cracks at the nipple shaft and she also has positional strips.  Teaching on good positioning and using support .  Assist mother with latching infant on in cross cradle hold.  Infant latched . Lips flanged well.  Mother doing breast compression and infant tugging strong with swallows observed.   Mothers breast are filling. She was given a harmony hand pump with a #30 flange.  Mother advised to post pump for 15 mins on each breast.  Suggested that mother page LC to assist with supplementing infant with any amt of ebm that she pumps.   Mother also given comfort gels. Staff nurse Dahlia Client to set up DEBP for mother is she is not discharged. Mother to pump after each feeding and supplement infant with ebm.   Discussed treatment and prevention of engorgement.  Mother reports that she is active with [facility] and she has an appt on Thursday. She hopes to get a DEBP at her appt.   Mother is aware of available LC services at [facility], [facility], Outpatient services. Mother to follow up with [facility] for any questions or concerns.     Patient Name: Kim White OEVOJ'J Date: 05/08/2018 Reason for consult: Follow-up assessment   Maternal Data    Feeding Feeding Type: Breast Fed  LATCH Score Latch: Grasps breast easily, tongue down, lips flanged, rhythmical sucking.  Audible Swallowing: Spontaneous and intermittent  Type of  Nipple: Everted at rest and after stimulation  Comfort (Breast/Nipple): Filling, red/small blisters or bruises, mild/mod discomfort  Hold (Positioning): Assistance needed to correctly position infant at breast and maintain latch.  LATCH Score: 8  Interventions Interventions: Assisted with latch;Skin to skin;Hand express;Breast compression;Adjust position;Support pillows;Position options;Expressed milk;Hand pump  Lactation Tools Discussed/Used WIC Program: Yes Pump Review: Setup, frequency, and cleaning;Milk Storage Initiated by:: [name] RN,IBCLC Date initiated:: 05/08/18   Consult Status Consult Status: Follow-up Date: 05/08/18 Follow-up type: In-patient    [name] [facility] 05/08/2018, 11:42 AM

## 2018-05-09 ENCOUNTER — Encounter: Payer: Medicaid Other | Admitting: Obstetrics

## 2018-05-09 ENCOUNTER — Ambulatory Visit: Payer: Self-pay

## 2018-05-09 NOTE — Lactation Note (Signed)
This note was copied from a baby's chart. Lactation Consultation Note  Patient Name: Kim White NSQZY'T Date: 05/09/2018 Reason for consult: Follow-up assessment;Infant weight loss;Early term 37-38.6wks;Hyperbilirubinemia  Visited with P2 Mom of ET infant at 42 hrs old.  Baby's weight is stable from yesterday, at 9% from birth weight. Baby's stools are transitioning.  Bilirubin 14.8  Mom breastfeeding baby on cue, or at least every 3 hrs.  Baby supplemented with EBM 20-30 ml by spoon or curved tip syringe.   Mom does not have a DEBP at home.  Has WIC, offered a [facility] loaner if discharged.  Also reassembled pump parts to show Mom how to make in to manual double pump.  Last pumping, Mom expressed 30 ml.  Mom to continue supplementing and pumping while baby is jaundiced and weight loss.    Mom interested in OP lactation follow-up.  Message requesting one sent.    Engorgement prevention and treatment reviewed.  Mom aware of OP lactation support available to her.   Interventions Interventions: Breast feeding basics reviewed;Skin to skin;Breast massage;Hand express;Hand pump;Expressed milk;Comfort gels  Lactation Tools Discussed/Used Tools: Pump;Comfort gels Breast pump type: Double-Electric Breast Pump   Consult Status Consult Status: Complete Date: 05/09/18 Follow-up type: Out-patient    Judee Clara 05/09/2018, 10:16 AM

## 2018-05-16 ENCOUNTER — Ambulatory Visit: Payer: Self-pay

## 2018-05-16 NOTE — Lactation Note (Signed)
This note was copied from a baby's chart. Lactation Consultation Note  Patient Name: Kim Meth [facility] BULAG'T Date: 05/16/2018   05/16/2018  Name: Kim White MRN: [id number] Date of Birth: 05/05/2016 Gestational Age: Gestational Age: [redacted]w[redacted]d Birth Weight: 109.5 oz Weight today:    6 pounds 10.7 ounces (3026 grams) with clean size 1 diaper    11 day old infant presents today with mom for feeding assessment. Mom and dad feels infant is feeding well at the breast. Mom BF older child for 18 months. Infant fed and mom pumped prior to coming for assessment. Infant is jaundiced and is being followed by Pediatrician. Labs were drawn yesterday. Infant with yellow seedy stools.   Infant has gained 211 grams in the last 7 days with an average daily weight gain 30 grams a day. Infant weighed 7 pounds 10 ounces at [facility] appt on 3/3, mom reports infant was weighed fully clothed. Infant is 78 grams below birth weight.   Infant feeds at the breast every 2-3 hours. Mom has to awaken him during the day and infant self awakens at night. Infant will feed on one breast for about 1/2 of his feedings and both breasts the other feeds. Mom always offers both breasts.   Infant is sleepy at the breast, mom uses awakening techniques as needed for feeding. Infant responds well to stimulation.   Infant is taking some bottles when dad has him and will take about 3 ounces per feeding. They are using the disposable Gerber nipples from the hospital. Discussed those nipples are made to be one time use and would recommend they not continue to use. Mom is not planning to give many bottles as mom will be able to stay home with infant. Discussed bottle flow, nipples and paced bottle feeding.   Mom is pumping after every feeding. She is getting 4-10 ounces per pumping. She is freezing the milk for later.   Infant latched to both breasts with this feeding. He did need a lot of stimulation to maintain suckling.  Mom did well with stimulation. Enc mom to keep infant awake with feeding as needed.    Infant to follow up with Dr. Laural Benes on 3/27. Infant to follow up with Lactation as needed at [facility] request. Parents live in [place] so do not qualify for [employer].   General Information: Mother's reason for visit: Feeding assessment, ET infant Consult: Initial Lactation consultant: Jasmine December Hice RN,IBCLC Breastfeeding experience: BF every 2-3 hours, one or both breasts, sleepy at the breast   Maternal medications: Pre-natal vitamin  Breastfeeding History: Frequency of breast feeding: every 3 hours during the day with parent awakening, every 2-2.5 hours at night, self awakening Duration of feeding: 15-20 minutes  Supplementation: Supplement method: bottle(Occasional when with dad)         Breast milk volume: 2-3 ounces Breast milk frequency: a few times a week   Pump type: Symphony(WIC Loaner) Pump frequency: every 2-3 hours Pump volume: 4-10 ounces  Infant Output Assessment: Voids per 24 hours: 8 Urine color: Clear yellow Stools per 24 hours: 4-5 Stool color: Yellow(yellow/seedy)  Breast Assessment: Breast: Soft, Compressible Nipple: Erect Pain level: 0 Pain interventions: Bra  Feeding Assessment: Infant oral assessment: Variance Infant oral assessment comment: Infant with thin labial frenulum that inserts near the bottom of the gum ridge, upper lip stretches well. Mom flanges upper lip as needed with feeding. Infant with strong suckle and good tongue mobility.  Positioning: Cross cradle(right breast, 10 minutes) Latch: 1 -  Repeated attempts needed to sustain latch, nipple held in mouth throughout feeding, stimulation needed to elicit sucking reflex. Audible swallowing: 2 - Spontaneous and intermittent Type of nipple: 2 - Everted at rest and after stimulation Comfort: 2 - Soft/non-tender Hold: 2 - No assistance needed to correctly position infant at breast LATCH  score: 9 Latch assessment: Deep Lips flanged: No(upper lip needs flanging) Suck assessment: Displays both   Pre-feed weight: 3026 grams Post feed weight: 3046 grams Amount transferred: 20 ml Amount supplemented: 0  Additional Feeding Assessment: Infant oral assessment: Variance Infant oral assessment comment: see above Positioning: Cross cradle(left nipple, 15 minutes) Latch: 1 - Repeated attempts neede to sustain latch, nipple held in mouth throughout feeding, stimulation needed to elicit sucking reflex. Audible swallowing: 2 - Spontaneous and intermittent Type of nipple: 2 - Everted at rest and after stimulation Comfort: 2 - Soft/non-tender Hold: 2 - No assistance needed to correctly position infant at breast LATCH score: 9 Latch assessment: Deep Lips flanged: No(upper lip needs flanging) Suck assessment: Displays both   Pre-feed weight: 3046 grams Post feed weight: 3054 grams Amount transferred: 8 ml Amount supplemented: 0  Totals: Total amount transferred: 28 ml Total supplement given: 0 Total amount pumped post feed: did not pump   Plan:  1. Offer infant the breast with feeding cues, continue making sure infant is getting about 8 feedings in 24 hours 2. Keep infant awake at the breast as needed for feeding, feed infant Skin to Skin 3. Massage/compress breast with feeding if infant sleepy and not suckling well 4. Empty one breast before offering the second breast 5. Offer infant a bottle of pumped breast milk if he is not feeding well at the breast or still cueing to feed after breast feeding 6. When using a bottle, use a slower flow nipple such as Dr. Theora Gianotti Level 1, Nuk Natural Flow or Avent Natural Flow Nipples 7. Feed infant using the paced bottle feeding method (video on [email or web address]) 8. Infant needs about 56-75 ml (2-2.5 ounces) for 8 feedings a day or 450-600 ml (15-20 ounces) in 24 hours. Infant may take more or less depending on how often he feeds. Feed infant  until he is satisfied.  9. Can start weaning pumping slowly. Would recommend that you first drop minutes per pumping first. Once comfortable and not as full, decrease time between pumpings and lastly drop one pumping a day every 3 days. Can continue pumping to store as you want. Review handout from [email or web address] 10. Keep up the good work 11. Thank you for allowing me to assist you today 12. Please call with any questions/concerns as needed [phone] 13. Follow up with Lactation as needed  Ed Blalock RN, IBCLC                                                     Kim White 05/16/2018, 10:14 AM

## 2018-05-21 ENCOUNTER — Other Ambulatory Visit: Payer: Self-pay | Admitting: Obstetrics

## 2018-06-05 ENCOUNTER — Encounter: Payer: Self-pay | Admitting: Obstetrics

## 2018-06-05 ENCOUNTER — Ambulatory Visit (INDEPENDENT_AMBULATORY_CARE_PROVIDER_SITE_OTHER): Payer: Medicaid Other | Admitting: Obstetrics

## 2018-06-05 DIAGNOSIS — Z1389 Encounter for screening for other disorder: Secondary | ICD-10-CM | POA: Diagnosis not present

## 2018-06-05 DIAGNOSIS — Z3009 Encounter for other general counseling and advice on contraception: Secondary | ICD-10-CM

## 2018-06-05 NOTE — Progress Notes (Signed)
Post Partum Exam  Kim White is a 33 y.o. G23P2002 female who presents for a postpartum visit. She is 4 weeks postpartum following a spontaneous vaginal delivery. I have fully reviewed the prenatal and intrapartum course. The delivery was at 38.2 gestational weeks.  Anesthesia: none. Postpartum course has been doing well. Baby's course has been complicated by "floppy airway". Baby is feeding by breast. Bleeding no bleeding. Bowel function is normal. Bladder function is normal. Patient is not sexually active. Contraception method is abstinence. Husband plans Vasectomy. Postpartum depression screening:neg, score 0.  The following portions of the patient's history were reviewed and updated as appropriate: allergies, current medications, past family history, past medical history, past social history, past surgical history and problem list. Last pap smear done 05/2017 and was Normal  Review of Systems A comprehensive review of systems was negative.    Objective:  unknown if currently breastfeeding.   PE:  Deferred   Assessment:   1. Postpartum care following vaginal delivery - doing well  2. Encounter for other general counseling and advice on contraception - husband to get a Vasectomy  Plan:   1. Contraception: vasectomy 2. Continue PNV's 3. Follow up in: 6 weeks or as needed.   Brock Bad MD 06-05-2018

## 2018-07-17 ENCOUNTER — Ambulatory Visit: Payer: Medicaid Other | Admitting: Obstetrics

## 2018-07-24 ENCOUNTER — Telehealth: Payer: Self-pay | Admitting: Obstetrics

## 2018-11-16 ENCOUNTER — Other Ambulatory Visit: Payer: Self-pay

## 2018-11-16 ENCOUNTER — Ambulatory Visit (HOSPITAL_COMMUNITY)
Admission: EM | Admit: 2018-11-16 | Discharge: 2018-11-16 | Disposition: A | Discharge status: Home / Self Care | Payer: Medicaid Other | Attending: Family Medicine | Admitting: Family Medicine

## 2018-11-16 DIAGNOSIS — H9202 Otalgia, left ear: Secondary | ICD-10-CM

## 2018-11-16 MED ORDER — CETIRIZINE HCL 10 MG PO TABS: 10.0000 mg | ORAL_TABLET | Freq: Every day | ORAL | 0 refills | Status: DC

## 2018-11-16 MED ORDER — TRIAMCINOLONE ACETONIDE 55 MCG/ACT NA AERO: 2.0000 | INHALATION_SPRAY | Freq: Every day | NASAL | 12 refills | Status: DC

## 2018-11-16 NOTE — ED Provider Notes (Signed)
[facility]    CSN: [id number] Arrival date & time: 11/16/18  1842      History   Chief Complaint Chief Complaint  Patient presents with  . Foreign Body in Ear    HPI Kim FickJamesha Davidoff is a 33 y.o. female.   Kim FickJamesha Villari presents with complaints of fullness sensation to left ear which she woke with this morning. No painful necessarily. Feels like her hearing is muffled. Certain positions feel like something is moving in the ear, she is concerned about possible FB. Occasional dizziness sensation and some pressure to left side of face. No URI symptoms or congestion. No fevers. No nasal drainage. No dental pain. Denies any previous similar. She tried putting water in the ear which didn't help. History  Of allergies, asthma.    ROS per HPI, negative if not otherwise mentioned.      Past Medical History:  Diagnosis Date  . Allergy   . Seasonal asthma     Patient Active Problem List   Diagnosis Date Noted  . Normal labor 05/05/2018  . Supervision of other normal pregnancy, antepartum 12/05/2017  . Moderate persistent asthma 04/29/2015  . 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 Perennial and seasonal allergic rhinoconjunctivitis 04/29/2015    Past Surgical History:  Procedure Laterality Date  . child birth  05/05/2011  . HERNIA REPAIR      OB History    Gravida  2   Para  2   Term  2   Preterm      AB      Living  2     SAB      TAB      Ectopic      Multiple  0   Live Births  2            Home Medications    Prior to Admission medications   Medication Sig Start Date End Date Taking? Authorizing Provider  albuterol (PROAIR HFA) 108 (90 Base) MCG/ACT inhaler Inhale 2 puffs into the lungs every 4 (four) hours as needed for wheezing or shortness of breath (cough). Reported on 05/11/2015    [provider]  cetirizine (ZYRTEC) 10 MG tablet Take 1 tablet (10 mg total) by mouth daily. 11/16/18   Georgetta HaberBurky, Analyssa Downs B, NP   EPINEPHrine (EPIPEN 2-PAK) 0.3 mg/0.3 mL IJ SOAJ injection Inject 0.3 mg into the muscle once. Reported on 05/11/2015    [provider]  ibuprofen (ADVIL,MOTRIN) 600 MG tablet Take 1 tablet (600 mg total) by mouth 4 (four) times daily. 05/07/18   Mullis, Kiersten P, DO  Prenatal Vit-Fe Fumarate-FA (PRENATAL MULTIVITAMIN) TABS tablet Take 1 tablet by mouth daily at 12 noon.    [provider]  senna-docusate (SENOKOT-S) 8.6-50 MG tablet Take 2 tablets by mouth daily. 05/08/18   Mullis, Kiersten P, DO  triamcinolone (NASACORT) 55 MCG/ACT AERO nasal inhaler Place 2 sprays into the nose daily. 11/16/18   Georgetta HaberBurky, Solyana Nonaka B, NP    Family History Family History  Problem Relation Age of Onset  . Asthma Brother   . Hyperlipidemia Maternal Grandmother   . Cancer Maternal Grandmother        Breast  . Arthritis Mother   . Hearing loss Mother   . Miscarriages / [place] Mother   . Hyperlipidemia Maternal Grandfather   . Heart disease Maternal Grandfather        in his 2240's  . Alcohol abuse Paternal Grandmother   . Stroke Father  suspected from substance  . Hypertension Father   . Cancer Other   . Allergic rhinitis Neg Hx   . Eczema Neg Hx   . Immunodeficiency Neg Hx   . Urticaria Neg Hx     Social History Social History   Tobacco Use  . Smoking status: Never Smoker  . Smokeless tobacco: Never Used  Substance Use Topics  . Alcohol use: No    Alcohol/week: 0.0 standard drinks  . Drug use: No     Allergies   Other and Shellfish allergy   Review of Systems Review of Systems   Physical Exam Triage Vital Signs ED Triage Vitals  Enc Vitals Group     BP 11/16/18 2017 122/66     Pulse Rate 11/16/18 2017 67     Resp 11/16/18 2017 14     Temp 11/16/18 2017 98.5 F (36.9 C)     Temp src --      SpO2 11/16/18 2017 98 %     Weight --      Height --      Head Circumference --      Peak Flow --      Pain Score 11/16/18 2016 0     Pain Loc --      Pain  Edu? --      Excl. in GC? --    No data found.  Updated Vital Signs BP 122/66 (BP Location: Left Arm)   Pulse 67   Temp 98.5 F (36.9 C)   Resp 14   SpO2 98%   Physical Exam Constitutional:      General: She is not in acute distress.    Appearance: She is well-developed.  HENT:     Right Ear: Tympanic membrane, ear canal and external ear normal. There is no impacted cerumen.     Left Ear: Tympanic membrane, ear canal and external ear normal. There is no impacted cerumen.     Ears:     Comments: No foreign body to left ear, no wax, no redness or swelling    Mouth/Throat:     Mouth: Mucous membranes are moist.  Cardiovascular:     Rate and Rhythm: Normal rate.  Pulmonary:     Effort: Pulmonary effort is normal.  Skin:    General: Skin is warm and dry.  Neurological:     Mental Status: She is alert and oriented to person, place, and time.      UC Treatments / Results  Labs (all labs ordered are listed, but only abnormal results are displayed) Labs Reviewed - No data to display  EKG   Radiology No results found.  Procedures Procedures (including critical care time)  Medications Ordered in UC Medications - No data to display  Initial Impression / Assessment and Plan / UC Course  I have reviewed the triage vital signs and the nursing notes.  Pertinent labs & imaging results that were available during my care of the patient were reviewed by me and considered in my medical decision making (see chart for details).     Exam grossly normal here tonight. Afebrile. Will try nasacort as well as zyrtec to see if this is helpful with symptoms. If symptoms worsen or do not improve in the next week to return to be seen or to follow up with PCP. Marland Kitchen Patient verbalized understanding and agreeable to plan.    Final Clinical Impressions(s) / UC Diagnoses   Final diagnoses:  Left ear pain  Discharge Instructions     Push fluids to ensure adequate hydration and keep  secretions thin.  Daily zyrtec and daily nasal spray.  If symptoms worsen or do not improve in the next week to return to be seen or to follow up with your PCP.     ED Prescriptions    Medication Sig Dispense Auth. Provider   triamcinolone (NASACORT) 55 MCG/ACT AERO nasal inhaler Place 2 sprays into the nose daily. 1 Inhaler Augusto Gamble B, NP   cetirizine (ZYRTEC) 10 MG tablet Take 1 tablet (10 mg total) by mouth daily. 30 tablet Zigmund Gottron, NP     Controlled Substance Prescriptions [place] Controlled Substance Registry consulted? Not Applicable   Zigmund Gottron, NP 11/16/18 2103

## 2018-11-16 NOTE — Discharge Instructions (Signed)
Push fluids to ensure adequate hydration and keep secretions thin.  Daily zyrtec and daily nasal spray.  If symptoms worsen or do not improve in the next week to return to be seen or to follow up with your PCP.

## 2018-11-16 NOTE — ED Triage Notes (Signed)
Patient presents to Urgent Care with complaints of possible bug in left ear since this afternoon. Patient reports she tried to get it out but it feels like it is still in there.

## 2019-01-06 ENCOUNTER — Encounter (HOSPITAL_COMMUNITY): Payer: Self-pay | Admitting: Emergency Medicine

## 2019-01-06 ENCOUNTER — Emergency Department (HOSPITAL_COMMUNITY)
Admission: EM | Admit: 2019-01-06 | Discharge: 2019-01-06 | Disposition: A | Discharge status: Home / Self Care | Payer: Medicaid Other | Attending: Emergency Medicine | Admitting: Emergency Medicine

## 2019-01-06 ENCOUNTER — Other Ambulatory Visit: Payer: Self-pay

## 2019-01-06 DIAGNOSIS — Z79899 Other long term (current) drug therapy: Secondary | ICD-10-CM | POA: Diagnosis not present

## 2019-01-06 DIAGNOSIS — J4521 Mild intermittent asthma with (acute) exacerbation: Secondary | ICD-10-CM | POA: Diagnosis not present

## 2019-01-06 DIAGNOSIS — R0602 Shortness of breath: Secondary | ICD-10-CM | POA: Diagnosis present

## 2019-01-06 MED ORDER — ALBUTEROL SULFATE (2.5 MG/3ML) 0.083% IN NEBU
5.0000 mg | INHALATION_SOLUTION | Freq: Once | RESPIRATORY_TRACT | Status: AC
  Administered 2019-01-06: 5 mg via RESPIRATORY_TRACT
  Filled 2019-01-06: qty 6

## 2019-01-06 MED ORDER — ALBUTEROL SULFATE HFA 108 (90 BASE) MCG/ACT IN AERS
2.0000 | INHALATION_SPRAY | RESPIRATORY_TRACT | Status: DC | PRN
  Administered 2019-01-06: 2 via RESPIRATORY_TRACT
  Filled 2019-01-06: qty 6.7

## 2019-01-06 NOTE — ED Triage Notes (Signed)
Pt with SOB x 2 hrs. States she could not find her rescue inhaler so she decided to come on in. Has asthma and used a seasoning that she states triggered an asthma attack.

## 2019-01-06 NOTE — ED Provider Notes (Signed)
[facility] EMERGENCY DEPARTMENT Provider Note   CSN: [id number] Arrival date & time: 01/06/19  2022     History   Chief Complaint Chief Complaint  Patient presents with  . Shortness of Breath    HPI Kim White is a 33 y.o. female.     Patient states that she is out of her inhaler and became short of breath and wheezing.  Patient has history of asthma  The history is provided by the patient.  Shortness of Breath Severity:  Moderate Onset quality:  Sudden Timing:  Constant Progression:  Worsening Chronicity:  New Context: activity   Relieved by:  Nothing Worsened by:  Nothing Ineffective treatments:  None tried Associated symptoms: no abdominal pain, no chest pain, no cough, no headaches and no rash     Past Medical History:  Diagnosis Date  . Allergy   . Seasonal asthma     Patient Active Problem List   Diagnosis Date Noted  . Normal labor 05/05/2018  . Supervision of other normal pregnancy, antepartum 12/05/2017  . Moderate persistent asthma 04/29/2015  . 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 Perennial and seasonal allergic rhinoconjunctivitis 04/29/2015    Past Surgical History:  Procedure Laterality Date  . child birth  05/05/2011  . HERNIA REPAIR       OB History    Gravida  2   Para  2   Term  2   Preterm      AB      Living  2     SAB      TAB      Ectopic      Multiple  0   Live Births  2            Home Medications    Prior to Admission medications   Medication Sig Start Date End Date Taking? Authorizing Provider  albuterol (PROAIR HFA) 108 (90 Base) MCG/ACT inhaler Inhale 2 puffs into the lungs every 4 (four) hours as needed for wheezing or shortness of breath (cough). Reported on 05/11/2015   Yes [provider]  EPINEPHrine (EPIPEN 2-PAK) 0.3 mg/0.3 mL IJ SOAJ injection Inject 0.3 mg into the muscle once. Reported on 05/11/2015   Yes [provider]  Prenatal Vit-Fe Fumarate-FA  (PRENATAL MULTIVITAMIN) TABS tablet Take 1 tablet by mouth daily at 12 noon.   Yes [provider]  cetirizine (ZYRTEC) 10 MG tablet Take 1 tablet (10 mg total) by mouth daily. Patient not taking: Reported on 01/06/2019 11/16/18   Georgetta Haber, NP    Family History Family History  Problem Relation Age of Onset  . Asthma Brother   . Hyperlipidemia Maternal Grandmother   . Cancer Maternal Grandmother        Breast  . Arthritis Mother   . Hearing loss Mother   . Miscarriages / [place] Mother   . Hyperlipidemia Maternal Grandfather   . Heart disease Maternal Grandfather        in his 62's  . Alcohol abuse Paternal Grandmother   . Stroke Father        suspected from substance  . Hypertension Father   . Cancer Other   . Allergic rhinitis Neg Hx   . Eczema Neg Hx   . Immunodeficiency Neg Hx   . Urticaria Neg Hx     Social History Social History   Tobacco Use  . Smoking status: Never Smoker  . Smokeless tobacco: Never Used  Substance Use Topics  .  Alcohol use: No    Alcohol/week: 0.0 standard drinks  . Drug use: No     Allergies   Other and Shellfish allergy   Review of Systems Review of Systems  Constitutional: Negative for appetite change and fatigue.  HENT: Negative for congestion, ear discharge and sinus pressure.   Eyes: Negative for discharge.  Respiratory: Positive for shortness of breath. Negative for cough.   Cardiovascular: Negative for chest pain.  Gastrointestinal: Negative for abdominal pain and diarrhea.  Genitourinary: Negative for frequency and hematuria.  Musculoskeletal: Negative for back pain.  Skin: Negative for rash.  Neurological: Negative for seizures and headaches.  Psychiatric/Behavioral: Negative for hallucinations.     Physical Exam Updated Vital Signs BP 117/78   Pulse 86   Temp 97.9 F (36.6 C)   Resp 20   Ht 5\' 2"  (1.575 m)   Wt 104.3 kg   SpO2 99%   BMI 42.07 kg/m   Physical Exam Vitals signs reviewed.   Constitutional:      Appearance: She is well-developed.  HENT:     Head: Normocephalic.     Mouth/Throat:     Mouth: Mucous membranes are moist.  Eyes:     General: No scleral icterus.    Conjunctiva/sclera: Conjunctivae normal.  Neck:     Musculoskeletal: Neck supple.     Thyroid: No thyromegaly.  Cardiovascular:     Rate and Rhythm: Normal rate and regular rhythm.     Heart sounds: No murmur. No friction rub. No gallop.   Pulmonary:     Breath sounds: No stridor. Wheezing present. No rales.  Chest:     Chest wall: No tenderness.  Abdominal:     General: There is no distension.     Tenderness: There is no abdominal tenderness. There is no rebound.  Musculoskeletal: Normal range of motion.  Lymphadenopathy:     Cervical: No cervical adenopathy.  Skin:    Findings: No erythema or rash.  Neurological:     Mental Status: She is alert and oriented to person, place, and time.     Motor: No abnormal muscle tone.     Coordination: Coordination normal.  Psychiatric:        Behavior: Behavior normal.      ED Treatments / Results  Labs (all labs ordered are listed, but only abnormal results are displayed) Labs Reviewed - No data to display  EKG None  Radiology No results found.  Procedures Procedures (including critical care time)  Medications Ordered in ED Medications  albuterol (VENTOLIN HFA) 108 (90 Base) MCG/ACT inhaler 2 puff (2 puffs Inhalation Given 01/06/19 2219)  albuterol (PROVENTIL) (2.5 MG/3ML) 0.083% nebulizer solution 5 mg (5 mg Nebulization Given 01/06/19 2110)     Initial Impression / Assessment and Plan / ED Course  I have reviewed the triage vital signs and the nursing notes.  Pertinent labs & imaging results that were available during my care of the patient were reviewed by me and considered in my medical decision making (see chart for details).       Patient improved with the albuterol.  She will be discharged home with inhaler follow-up  with her PCP  Final Clinical Impressions(s) / ED Diagnoses   Final diagnoses:  Mild intermittent asthma with exacerbation    ED Discharge Orders    None       Milton Ferguson, MD 01/06/19 2233

## 2019-01-06 NOTE — ED Notes (Signed)
Notified respiratory of nebulizer treatment.

## 2019-01-06 NOTE — Discharge Instructions (Addendum)
Use your inhaler every 6 hours as needed.  Follow-up with your doctor if any problem

## 2019-03-29 DIAGNOSIS — S161XXA Strain of muscle, fascia and tendon at neck level, initial encounter: Secondary | ICD-10-CM | POA: Diagnosis not present

## 2019-03-29 DIAGNOSIS — Z91013 Allergy to seafood: Secondary | ICD-10-CM | POA: Diagnosis not present

## 2019-03-29 DIAGNOSIS — I10 Essential (primary) hypertension: Secondary | ICD-10-CM | POA: Diagnosis not present

## 2019-03-29 DIAGNOSIS — R509 Fever, unspecified: Secondary | ICD-10-CM | POA: Diagnosis not present

## 2019-04-12 ENCOUNTER — Other Ambulatory Visit: Payer: Self-pay

## 2019-04-12 ENCOUNTER — Ambulatory Visit
Admission: EM | Admit: 2019-04-12 | Discharge: 2019-04-12 | Disposition: A | Discharge status: Home / Self Care | Payer: Medicaid Other | Attending: Emergency Medicine | Admitting: Emergency Medicine

## 2019-04-12 DIAGNOSIS — M25512 Pain in left shoulder: Secondary | ICD-10-CM | POA: Diagnosis not present

## 2019-04-12 DIAGNOSIS — R200 Anesthesia of skin: Secondary | ICD-10-CM

## 2019-04-12 MED ORDER — METHOCARBAMOL 500 MG PO TABS: 500.0000 mg | ORAL_TABLET | Freq: Two times a day (BID) | ORAL | 0 refills | Status: DC

## 2019-04-12 MED ORDER — IBUPROFEN 800 MG PO TABS: 800.0000 mg | ORAL_TABLET | Freq: Three times a day (TID) | ORAL | 0 refills | Status: DC

## 2019-04-12 NOTE — Discharge Instructions (Signed)
Pain ibuprofen prescribed for pain Robaxin prescribed Advised patient to follow-up with primary care for further referral to neurology To return for worsening symptoms
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
## 2019-04-22 ENCOUNTER — Encounter: Payer: Self-pay | Admitting: Family Medicine

## 2019-04-22 ENCOUNTER — Other Ambulatory Visit: Payer: Self-pay

## 2019-04-22 ENCOUNTER — Ambulatory Visit (INDEPENDENT_AMBULATORY_CARE_PROVIDER_SITE_OTHER): Payer: Medicaid Other | Admitting: Family Medicine

## 2019-04-22 VITALS — BP 114/86 | HR 68 | Temp 97.8°F | Resp 18 | Ht 62.0 in | Wt 223.0 lb

## 2019-04-22 DIAGNOSIS — S143XXA Injury of brachial plexus, initial encounter: Secondary | ICD-10-CM | POA: Diagnosis not present

## 2019-04-22 MED ORDER — PREDNISONE 20 MG PO TABS: ORAL_TABLET | ORAL | 0 refills | Status: DC
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
## 2020-01-17 ENCOUNTER — Other Ambulatory Visit: Payer: Self-pay

## 2020-01-17 ENCOUNTER — Ambulatory Visit: Payer: Medicaid Other | Admitting: Internal Medicine

## 2020-01-17 ENCOUNTER — Encounter: Payer: Self-pay | Admitting: Internal Medicine

## 2020-01-17 VITALS — BP 121/83 | HR 86 | Wt 231.9 lb

## 2020-01-17 DIAGNOSIS — Z0000 Encounter for general adult medical examination without abnormal findings: Secondary | ICD-10-CM | POA: Diagnosis not present

## 2020-01-17 DIAGNOSIS — Z020 Encounter for examination for admission to educational institution: Secondary | ICD-10-CM | POA: Diagnosis not present

## 2020-01-17 NOTE — Progress Notes (Signed)
New Patient Office Visit  Subjective:  Patient ID: Kim White, female    DOB: September 24, 1985  Age: 34 y.o. MRN: [id number]  CC:  Chief Complaint  Patient presents with  . Establish Care    requests hep b/varicella titer for school   Requesting labs for school  HPI [name] presents for obtaing labs for school.  This is her first visit at [facility], she mentions she may be interested in switch PCP to our clinic but she will decide later. Please refer to problem based charting for further details and assessment and plan of current problem and chronic medical conditions.  Past Medical History:  Diagnosis Date  . Allergy   . Seasonal asthma     Past Surgical History:  Procedure Laterality Date  . child birth  05/05/2011  . HERNIA REPAIR      Family History  Problem Relation Age of Onset  . Asthma Brother   . Hyperlipidemia Maternal Grandmother   . Cancer Maternal Grandmother        Breast  . Arthritis Mother   . Hearing loss Mother   . Miscarriages / [place] Mother   . Hyperlipidemia Maternal Grandfather   . Heart disease Maternal Grandfather        in his 35's  . Alcohol abuse Paternal Grandmother   . Stroke Father        suspected from substance  . Hypertension Father   . Cancer Other   . Allergic rhinitis Neg Hx   . Eczema Neg Hx   . Immunodeficiency Neg Hx   . Urticaria Neg Hx    Social Hx: Does not smoke No alcohol use No Illicit drug use   FHx: Maternal GM with breast cancer Heart disease  Social History   Socioeconomic History  . Marital status: Married    Spouse name: Not on file  . Number of children: 2  . Years of education: HS  . Highest education level: Not on file  Occupational History  . Not on file  Tobacco Use  . Smoking status: Never Smoker  . Smokeless tobacco: Never Used  Vaping Use  . Vaping Use: Never used  Substance and Sexual Activity  . Alcohol use: No    Alcohol/week: 0.0 standard drinks  . Drug use: No  .  Sexual activity: Yes    Birth control/protection: None    Comment: married, 2 boys  Other Topics Concern  . Not on file  Social History Narrative   Denies caffeine use    Social Determinants of Health   Financial Resource Strain:   . Difficulty of Paying Living Expenses: Not on file  Food Insecurity:   . Worried About Programme researcher, broadcasting/film/video in the Last Year: Not on file  . Ran Out of Food in the Last Year: Not on file  Transportation Needs:   . Lack of Transportation (Medical): Not on file  . Lack of Transportation (Non-Medical): Not on file  Physical Activity:   . Days of Exercise per Week: Not on file  . Minutes of Exercise per Session: Not on file  Stress:   . Feeling of Stress : Not on file  Social Connections:   . Frequency of Communication with Friends and Family: Not on file  . Frequency of Social Gatherings with Friends and Family: Not on file  . Attends Religious Services: Not on file  . Active Member of Clubs or Organizations: Not on file  . Attends Banker Meetings: Not  on file  . Marital Status: Not on file  Intimate Partner Violence:   . Fear of Current or Ex-Partner: Not on file  . Emotionally Abused: Not on file  . Physically Abused: Not on file  . Sexually Abused: Not on file    ROS Review of Systems Constitutional: Negative for chills and fever.  Respiratory: Negative for shortness of breath.   Cardiovascular: Negative for chest pain and leg swelling.  Gastrointestinal: Negative for abdominal pain, nausea and vomiting.    Objective:   Today's Vitals: BP 121/83 (BP Location: Left Arm, Patient Position: Sitting, Cuff Size: Normal)   Pulse 86   Wt 231 lb 14.4 oz (105.2 kg)   SpO2 98%   BMI 42.42 kg/m   Physical Exam Constitutional:      General: She is not in acute distress.    Appearance: Normal appearance. She is obese. She is not ill-appearing.  HENT:     Head: Normocephalic and atraumatic.  Eyes:     Extraocular Movements:  Extraocular movements intact.  Cardiovascular:     Rate and Rhythm: Normal rate and regular rhythm.     Pulses: Normal pulses.     Heart sounds: Normal heart sounds. No murmur heard.   Pulmonary:     Effort: Pulmonary effort is normal. No respiratory distress.     Breath sounds: Normal breath sounds. No wheezing or rales.  Abdominal:     General: Bowel sounds are normal.     Palpations: Abdomen is soft.  Musculoskeletal:        General: No swelling.     Right lower leg: No edema.     Left lower leg: No edema.  Skin:    General: Skin is warm and dry.  Neurological:     Mental Status: She is alert and oriented to person, place, and time. Mental status is at baseline.  Psychiatric:        Mood and Affect: Mood normal.        Behavior: Behavior normal.        Thought Content: Thought content normal.        Judgment: Judgment normal.     Assessment & Plan:   Problem List Items Addressed This Visit      Other   Health care maintenance    Health care maintenance: No prior hepatitis C screening in chart. She has received COVID shot 7-10/2019  -HCV antibody test today       Relevant Orders   Hepatitis C antibody   School health examination - Primary    Patient presents for obtaing labs for school. Specifically mentions that she needs varicella and Hep B Ab titer.  -Hep B surface Ab quantitative -VZV IgG      Relevant Orders   Varicella zoster antibody, IgG   Hepatitis B surface antibody,quantitative        Outpatient Encounter Medications as of 01/17/2020  Medication Sig  . albuterol (PROAIR HFA) 108 (90 Base) MCG/ACT inhaler Inhale 2 puffs into the lungs every 4 (four) hours as needed for wheezing or shortness of breath (cough). Reported on 05/11/2015  . EPINEPHrine (EPIPEN 2-PAK) 0.3 mg/0.3 mL IJ SOAJ injection Inject 0.3 mg into the muscle once. Reported on 05/11/2015  . Prenatal Vit-Fe Fumarate-FA (PRENATAL MULTIVITAMIN) TABS tablet Take 1 tablet by mouth daily  at 12 noon.  . [DISCONTINUED] predniSONE (DELTASONE) 20 MG tablet 3 tabs poqday 1-2, 2 tabs poqday 3-4, 1 tab poqday 5-6   No facility-administered encounter medications  on file as of 01/17/2020.    Follow-up: Return if symptoms worsen or fail to improve, for To establish care at [facility] if patient decided to.  Patient discussed with Dr. Brandon Melnick, MD

## 2020-01-17 NOTE — Patient Instructions (Signed)
Thank you for allowing [place] to provide your care today. Today you came in for blood work and check your immunity against Hep B and varicella as requested by your school    I have ordered the labs for you. I also screen you for Hep C.  Today we made no changes to your medications.    Please follow-up with [place] as needed or if you would like to establish care with [place].  Should you have any questions or concerns please call the internal medicine clinic at [phone].

## 2020-01-17 NOTE — Assessment & Plan Note (Signed)
Patient presents for obtaing labs for school. Specifically mentions that she needs varicella and Hep B Ab titer.  -Hep B surface Ab quantitative -VZV IgG

## 2020-01-17 NOTE — Assessment & Plan Note (Addendum)
Health care maintenance: No prior hepatitis C screening in chart. She has received COVID shot 7-10/2019  -HCV antibody test today

## 2020-01-18 LAB — HEPATITIS C ANTIBODY: Hep C Virus Ab: <0.1 s/co ratio (ref 0.0–0.9)

## 2020-01-18 LAB — HEPATITIS B SURFACE ANTIBODY, QUANTITATIVE: Hepatitis B Surf Ab Quant: 148.1 m[IU]/mL (ref >9.9)

## 2020-01-18 LAB — VARICELLA ZOSTER ANTIBODY, IGG: Varicella zoster IgG: 519 index (ref >165)

## 2020-01-20 NOTE — Progress Notes (Signed)
Internal Medicine Clinic Attending  Case discussed with Dr. Masoudi  At the time of the visit.  We reviewed the resident's history and exam and pertinent patient test results.  I agree with the assessment, diagnosis, and plan of care documented in the resident's note.  

## 2020-03-31 DIAGNOSIS — Z20822 Contact with and (suspected) exposure to covid-19: Secondary | ICD-10-CM | POA: Diagnosis not present

## 2020-08-24 ENCOUNTER — Encounter: Payer: Self-pay | Admitting: Podiatry

## 2020-08-24 ENCOUNTER — Other Ambulatory Visit: Payer: Self-pay

## 2020-08-24 ENCOUNTER — Ambulatory Visit (INDEPENDENT_AMBULATORY_CARE_PROVIDER_SITE_OTHER): Payer: Medicaid Other | Admitting: Podiatry

## 2020-08-24 ENCOUNTER — Ambulatory Visit (INDEPENDENT_AMBULATORY_CARE_PROVIDER_SITE_OTHER): Payer: Medicaid Other

## 2020-08-24 DIAGNOSIS — M79672 Pain in left foot: Secondary | ICD-10-CM | POA: Diagnosis not present

## 2020-08-24 DIAGNOSIS — M778 Other enthesopathies, not elsewhere classified: Secondary | ICD-10-CM | POA: Diagnosis not present

## 2020-08-24 DIAGNOSIS — M722 Plantar fascial fibromatosis: Secondary | ICD-10-CM

## 2020-08-24 DIAGNOSIS — M79671 Pain in right foot: Secondary | ICD-10-CM | POA: Diagnosis not present

## 2020-08-24 MED ORDER — MELOXICAM 15 MG PO TABS: 15.0000 mg | ORAL_TABLET | Freq: Every day | ORAL | 0 refills | Status: AC

## 2020-08-24 NOTE — Patient Instructions (Signed)

## 2020-08-25 ENCOUNTER — Other Ambulatory Visit: Payer: Self-pay | Admitting: Podiatry

## 2020-08-25 DIAGNOSIS — M722 Plantar fascial fibromatosis: Secondary | ICD-10-CM

## 2020-08-26 NOTE — Progress Notes (Signed)
Subjective:   Patient ID: Kim White, female   DOB: 35 y.o.   MRN: [id number]   HPI 35 year old female presents the office with concerns of bilateral foot pain with the left and right side equal.  She points on the heel and the arch of the foot where she gets discomfort.  The pain is intermittent but daily.  She denies recent injury or trauma denies any swelling.  No rating pain or weakness.  She has no other concerns today.  No recent treatment.   Review of Systems  All other systems reviewed and are negative.  Past Medical History:  Diagnosis Date   Allergy    Seasonal asthma     Past Surgical History:  Procedure Laterality Date   child birth  05/05/2011   HERNIA REPAIR       Current Outpatient Medications:    meloxicam (MOBIC) 15 MG tablet, Take 1 tablet (15 mg total) by mouth daily., Disp: 30 tablet, Rfl: 0  Allergies  Allergen Reactions   Other Nausea And Vomiting    ALL TREE NUTS   Shellfish Allergy Swelling          Objective:  Physical Exam  General: AAO x3, NAD  Dermatological: Skin is warm, dry and supple bilateral.  There are no open sores, no preulcerative lesions, no rash or signs of infection present.  Vascular: Dorsalis Pedis artery and Posterior Tibial artery pedal pulses are 2/4 bilateral with immedate capillary fill time.  There is no pain with calf compression, swelling, warmth, erythema.   Neruologic: Grossly intact via light touch bilateral.  Negative Tinel sign.  Musculoskeletal: There is mild tenderness to palpation along the plantar medial tubercle of the calcaneus at the insertion of plantar fascia on the left and right foot. There is mild discomfort along the course of the plantar fascia within the arch of the foot. Plantar fascia appears to be intact. There is no pain with lateral compression of the calcaneus or pain with vibratory sensation. There is no pain along the course or insertion of the achilles tendon. No other areas of  tenderness to bilateral lower extremities. Muscular strength 5/5 in all groups tested bilateral.  Gait: Unassisted, Nonantalgic.       Assessment:   35 year old female bilateral heel pain, plantar fasciitis     Plan:  -Treatment options discussed including all alternatives, risks, and complications -Etiology of symptoms were discussed -X-rays were obtained and reviewed with the patient.  No evidence of acute fracture or stress fracture. -We discussed steroid injection but ultimately held off on this today.  Prescribe meloxicam and discussed side effects.  Discussed stretching, icing daily.  We discussed shoe modifications and wearing better arch supports.  Vivi Barrack DPM

## 2020-09-15 ENCOUNTER — Encounter: Payer: Self-pay | Admitting: *Deleted

## 2021-01-04 ENCOUNTER — Emergency Department (HOSPITAL_COMMUNITY)
Admission: EM | Admit: 2021-01-04 | Discharge: 2021-01-04 | Disposition: A | Discharge status: Home / Self Care | Payer: Medicaid Other | Attending: Emergency Medicine | Admitting: Emergency Medicine

## 2021-01-04 ENCOUNTER — Other Ambulatory Visit: Payer: Self-pay

## 2021-01-04 ENCOUNTER — Encounter (HOSPITAL_COMMUNITY): Payer: Self-pay | Admitting: *Deleted

## 2021-01-04 DIAGNOSIS — K047 Periapical abscess without sinus: Secondary | ICD-10-CM

## 2021-01-04 DIAGNOSIS — J4540 Moderate persistent asthma, uncomplicated: Secondary | ICD-10-CM | POA: Insufficient documentation

## 2021-01-04 DIAGNOSIS — E669 Obesity, unspecified: Secondary | ICD-10-CM | POA: Insufficient documentation

## 2021-01-04 DIAGNOSIS — R220 Localized swelling, mass and lump, head: Secondary | ICD-10-CM | POA: Diagnosis present

## 2021-01-04 MED ORDER — LIDOCAINE-EPINEPHRINE (PF) 2 %-1:200000 IJ SOLN: 10.0000 mL | Freq: Once | INTRAMUSCULAR | Status: DC

## 2021-01-04 MED ORDER — LIDOCAINE HCL (PF) 2 % IJ SOLN
INTRAMUSCULAR | Status: AC
  Administered 2021-01-04: 10 mL
  Filled 2021-01-04: qty 10

## 2021-01-04 MED ORDER — LIDOCAINE HCL (PF) 2 % IJ SOLN: 10.0000 mL | Freq: Once | INTRAMUSCULAR | Status: AC

## 2021-01-04 MED ORDER — IBUPROFEN 400 MG PO TABS
600.0000 mg | ORAL_TABLET | Freq: Once | ORAL | Status: AC
  Administered 2021-01-04: 600 mg via ORAL
  Filled 2021-01-04: qty 2

## 2021-01-04 MED ORDER — LIDOCAINE HCL (PF) 2 % IJ SOLN
INTRAMUSCULAR | Status: AC
  Filled 2021-01-04: qty 10

## 2021-01-04 MED ORDER — AMOXICILLIN-POT CLAVULANATE 875-125 MG PO TABS: 1.0000 | ORAL_TABLET | Freq: Two times a day (BID) | ORAL | 0 refills | Status: DC

## 2021-01-04 MED ORDER — AMOXICILLIN-POT CLAVULANATE 875-125 MG PO TABS
1.0000 | ORAL_TABLET | Freq: Once | ORAL | Status: AC
  Administered 2021-01-04: 1 via ORAL
  Filled 2021-01-04: qty 1

## 2021-01-04 NOTE — ED Provider Notes (Signed)
[name] EMERGENCY DEPARTMENT Provider Note   CSN: [id number] Arrival date & time: 01/04/21  [id number]     History Chief Complaint  Patient presents with   Facial Swelling    Kim White is a 35 y.o. female.  HPI 35 year old female presents with right facial swelling.  She has had lower jaw swelling for about 3 or 4 days.  No fevers.  She has been taking ibuprofen with some partial relief.  Right now the pain is about a 7 but whenever she takes some topical meds and ibuprofen and goes down to a 3.  It hurts when she swallows but she is able to swallow.  No shortness of breath.  She has not noticed any dental pain though she has a chronically broken tooth on her right mandibular teeth but she has noticed a "split" in her gumline on the mandibular side. Denies any chance of being pregnant.  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   Perennial and seasonal allergic rhinoconjunctivitis 04/29/2015    Past Surgical History:  Procedure Laterality Date   child birth  05/05/2011   HERNIA REPAIR       OB History     Gravida  2   Para  2   Term  2   Preterm      AB      Living  2      SAB      IAB      Ectopic      Multiple  0   Live Births  2           Family History  Problem Relation Age of Onset   Asthma Brother    Hyperlipidemia Maternal Grandmother    Cancer Maternal Grandmother        Breast   Arthritis Mother    Hearing loss Mother    Miscarriages / [place] Mother    Hyperlipidemia Maternal Grandfather    Heart disease Maternal Grandfather        in his 84's   Alcohol abuse Paternal Grandmother     Stroke Father        suspected from substance   Hypertension Father    Cancer Other    Allergic rhinitis Neg Hx    Eczema Neg Hx    Immunodeficiency Neg Hx    Urticaria Neg Hx     Social History   Tobacco Use   Smoking status: Never   Smokeless tobacco: Never  Vaping Use   Vaping Use: Never used  Substance Use Topics   Alcohol use: No    Alcohol/week: 0.0 standard drinks   Drug use: No    Home Medications Prior to Admission medications   Medication Sig Start Date End Date Taking? Authorizing Provider  amoxicillin-clavulanate (AUGMENTIN) 875-125 MG tablet Take 1 tablet by mouth 2 (two) times daily. One po bid x 7 days 01/04/21  Yes Pricilla Loveless, MD  ibuprofen (ADVIL) 200 MG tablet Take 200 mg by mouth every 6 (six) hours as needed for mild pain.   Yes [provider]  meloxicam (MOBIC) 15 MG tablet Take 1 tablet (15 mg  total) by mouth daily. Patient not taking: Reported on 01/04/2021 08/24/20 08/24/21  Vivi Barrack, DPM    Allergies    Other and Shellfish allergy  Review of Systems   Review of Systems  Constitutional:  Negative for fever.  HENT:  Positive for facial swelling. Negative for dental problem.   Respiratory:  Negative for shortness of breath.    Physical Exam Updated Vital Signs BP (!) 127/96 (BP Location: Right Arm)   Pulse 82   Temp 98.6 F (37 C) (Oral)   Resp 18   Ht 5\' 2"  (1.575 m)   Wt 104.3 kg   LMP  (Within Weeks)   SpO2 99%   BMI 42.07 kg/m   Physical Exam Vitals and nursing note reviewed.  Constitutional:      General: She is not in acute distress.    Appearance: She is well-developed. She is obese. She is not ill-appearing or diaphoretic.  HENT:     Head: Normocephalic and atraumatic.     Jaw: No trismus.      Right Ear: External ear normal.     Left Ear: External ear normal.     Nose: Nose normal.     Mouth/Throat:   Eyes:     General:        Right eye: No discharge.        Left eye: No discharge.   Cardiovascular:     Rate and Rhythm: Normal rate and regular rhythm.     Heart sounds: Normal heart sounds.  Pulmonary:     Effort: Pulmonary effort is normal.     Breath sounds: Normal breath sounds.  Abdominal:     Palpations: Abdomen is soft.     Tenderness: There is no abdominal tenderness.  Skin:    General: Skin is warm and dry.  Neurological:     Mental Status: She is alert.  Psychiatric:        Mood and Affect: Mood is not anxious.    ED Results / Procedures / Treatments   Labs (all labs ordered are listed, but only abnormal results are displayed) Labs Reviewed - No data to display  EKG None  Radiology No results found.  Procedures . Incision and Drainage  Date/Time: 01/04/2021 8:33 AM Performed by: 01/06/2021, MD Authorized by: Pricilla Loveless, MD   Consent:    Consent obtained:  Verbal   Consent given by:  Patient   Risks, benefits, and alternatives were discussed: yes     Risks discussed:  Bleeding, damage to other organs, infection and pain Universal protocol:    Patient identity confirmed:  Verbally with patient Location:    Type:  Abscess   Size:  2 cm   Location:  Mouth   Mouth location:  Alveolar process Anesthesia:    Anesthesia method:  Local infiltration   Local anesthetic:  Lidocaine 2% w/o epi Procedure type:    Complexity:  Simple Procedure details:    Incision depth:  Dermal   Drainage:  Purulent   Drainage amount:  Moderate   Wound treatment:  Wound left open Post-procedure details:    Procedure completion:  Tolerated with difficulty Comments:     Patient tolerated anesthesia with difficulty but then it was noticed that there was significant purulent drainage and deflation of the abscess with just the needle for anesthesia so further incision was deferred   Medications Ordered in ED Medications  ibuprofen (ADVIL) tablet 600 mg (600 mg Oral Given 01/04/21 0755)  amoxicillin-clavulanate (AUGMENTIN)  875-125 MG per tablet 1  tablet (1 tablet Oral Given 01/04/21 0755)  lidocaine HCl (PF) (XYLOCAINE) 2 % injection 10 mL (10 mLs Other Given by Other 01/04/21 0831)  lidocaine HCl (PF) (XYLOCAINE) 2 % injection (  Return to [facility] 01/04/21 9518)    ED Course  I have reviewed the triage vital signs and the nursing notes.  Pertinent labs & imaging results that were available during my care of the patient were reviewed by me and considered in my medical decision making (see chart for details).    MDM Rules/Calculators/A&P                           Patient's facial swelling appears to be from a dental abscess.  Given her well appearance and stable vitals I do not think she has a deep space infection.  The gum abscess was drained as above.  We will also put on Augmentin and referred to dentistry.  Given return precautions. Continue NSAIDs.  Final Clinical Impression(s) / ED Diagnoses Final diagnoses:  Dental abscess    Rx / DC Orders ED Discharge Orders          Ordered    amoxicillin-clavulanate (AUGMENTIN) 875-125 MG tablet  2 times daily        01/04/21 8416             Pricilla Loveless, MD 01/04/21 [phone]

## 2021-01-04 NOTE — ED Notes (Signed)
Pharmacy called for lidocaine-EPINEPHrine.

## 2021-01-04 NOTE — ED Triage Notes (Signed)
Pt c/o right lower jaw swelling since last night. Pain to the area x 1 week.

## 2021-01-04 NOTE — Discharge Instructions (Addendum)
Return to the ER if you develop new or worsening swelling in your face, the swelling does not improve over the next couple days, if you develop fever, trouble breathing or swallowing, new or worsening pain, or any other new/concerning symptoms.  Call your dentist today as you will need outpatient follow-up and possibly more treatment.  If you do not have a dentist, he may call the 1 on-call as listed here.

## 2021-01-05 ENCOUNTER — Telehealth: Payer: Self-pay

## 2021-01-05 NOTE — Telephone Encounter (Signed)
Transition Care Management Follow-up Telephone Call Date of discharge and from where: 01/04/2021[facility] How have you been since you were released from the hospital? Patient stated she is doing better, and has started her medication.  Any questions or concerns? No  Items Reviewed: Did the pt receive and understand the discharge instructions provided? Yes  Medications obtained and verified? Yes  Other? No  Any new allergies since your discharge? No  Dietary orders reviewed? No Do you have support at home? Yes   Home Care and Equipment/Supplies: Were home health services ordered? not applicable If so, what is the name of the agency? N/A  Has the agency set up a time to come to the patient's home? not applicable Were any new equipment or medical supplies ordered?  No What is the name of the medical supply agency? N/A Were you able to get the supplies/equipment? not applicable Do you have any questions related to the use of the equipment or supplies? No  Functional Questionnaire: (I = Independent and D = Dependent) ADLs: I  Bathing/Dressing- I  Meal Prep- I  Eating- I  Maintaining continence- I  Transferring/Ambulation- I  Managing Meds- I  Follow up appointments reviewed:  PCP Hospital f/u appt confirmed? No   Specialist Hospital f/u appt confirmed? No   Are transportation arrangements needed? No  If their condition worsens, is the pt aware to call PCP or go to the Emergency Dept.? Yes Was the patient provided with contact information for the PCP's office or ED? Yes Was to pt encouraged to call back with questions or concerns? Yes

## 2022-06-22 ENCOUNTER — Ambulatory Visit: Payer: Medicaid Other

## 2022-07-06 ENCOUNTER — Ambulatory Visit: Payer: Medicaid Other

## 2022-07-13 ENCOUNTER — Ambulatory Visit (INDEPENDENT_AMBULATORY_CARE_PROVIDER_SITE_OTHER): Payer: Medicaid Other

## 2022-07-13 ENCOUNTER — Other Ambulatory Visit: Payer: Self-pay

## 2022-07-13 ENCOUNTER — Encounter: Payer: Self-pay | Admitting: Emergency Medicine

## 2022-07-13 ENCOUNTER — Ambulatory Visit
Admission: EM | Admit: 2022-07-13 | Discharge: 2022-07-13 | Disposition: A | Discharge status: Home / Self Care | Payer: Medicaid Other | Attending: Nurse Practitioner | Admitting: Nurse Practitioner

## 2022-07-13 DIAGNOSIS — M25461 Effusion, right knee: Secondary | ICD-10-CM

## 2022-07-13 DIAGNOSIS — M79604 Pain in right leg: Secondary | ICD-10-CM | POA: Diagnosis not present

## 2022-07-13 DIAGNOSIS — M1711 Unilateral primary osteoarthritis, right knee: Secondary | ICD-10-CM | POA: Diagnosis not present

## 2022-07-13 NOTE — Discharge Instructions (Addendum)
The x-ray of the right knee is negative for fracture or dislocation, but does show degenerative changes along with fluid. Recommend over-the-counter Tylenol or ibuprofen as needed for pain, fever, or general discomfort. Wear the Ace wrap to provide compression and support of the right knee. Recommend elevating the lower extremities above the level of the heart is much as possible to help decrease swelling. Continue use of your compression hose. May use ice to the right knee to help with swelling.  Apply for 20 minutes, remove for 1 hour, then repeat as much as possible. If symptoms continue to persist, would like for you to follow-up with your primary care physician for further evaluation. Follow-up as needed.

## 2022-07-13 NOTE — ED Triage Notes (Signed)
Pt reports RLE swelling and intermittent numbness x2 days. Denies any known injury or back pain. Pt able to bear weight but with pain.

## 2022-07-13 NOTE — ED Provider Notes (Signed)
[facility]    CSN: [id number] Arrival date & time: 07/13/22  1712      History   Chief Complaint Chief Complaint  Patient presents with   Leg Swelling    HPI Kim White is a 37 y.o. female.   The history is provided by the patient.   The patient presents with plaints of right lower extremity swelling and intermittent numbness over the past 2 days.  Patient states she was at work, and she looked down, and she noticed that her right knee was swollen.  She states that she also noticed intermittent numbness that starts at the right hip and goes down into her right lower extremity.  She states that she also noticed that she has more swelling in the right lower leg.  She states that she does wear compression socks at work, and noticed after taking off the compression sock that there was more swelling in the right leg.  Patient denies injury, trauma, tingling, inability to bear weight, lower extremity weakness, loss of bowel or bladder function, diabetes, or hypertension.  She further denies recent travel, or smoking.  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 Authorizing Provider   amoxicillin-clavulanate (AUGMENTIN) 875-125 MG tablet Take 1 tablet by mouth 2 (two) times daily. One po bid x 7 days 01/04/21   Pricilla Loveless, MD  ibuprofen (ADVIL) 200 MG tablet Take 200 mg by mouth every 6 (six) hours as needed for mild pain.    [provider]    Family History Family History  Problem Relation Age of Onset   Asthma Brother    Hyperlipidemia Maternal Grandmother    Cancer Maternal Grandmother        Breast   Arthritis Mother    Hearing loss Mother    Miscarriages / [place] Mother    Hyperlipidemia Maternal Grandfather    Heart disease Maternal Grandfather        in his 33's   Alcohol abuse Paternal Grandmother    Stroke Father        suspected from substance   Hypertension Father    Cancer Other    Allergic rhinitis Neg Hx    Eczema Neg Hx    Immunodeficiency Neg Hx    Urticaria Neg Hx     Social History Social History   Tobacco Use   Smoking status: Never   Smokeless  tobacco: Never  Vaping Use   Vaping Use: Never used  Substance Use Topics   Alcohol use: No    Alcohol/week: 0.0 standard drinks of alcohol   Drug use: No     Allergies   Other and Shellfish allergy   Review of Systems Review of Systems Per HPI  Physical Exam Triage Vital Signs ED Triage Vitals [07/13/22 1826]  Enc Vitals Group     BP 122/81     Pulse Rate 99     Resp 20     Temp 99.6 F (37.6 C)     Temp Source Oral     SpO2 95 %     Weight      Height      Head Circumference      Peak Flow      Pain Score 0     Pain Loc      Pain Edu?      Excl. in GC?    No data found.  Updated Vital Signs BP 122/81 (BP Location: Right Arm)   Pulse 99   Temp 99.6 F (37.6 C) (Oral)   Resp 20   LMP 06/26/2022 (Approximate)   SpO2 95%   Breastfeeding No   Visual Acuity Right Eye Distance:   Left Eye Distance:   Bilateral Distance:    Right Eye Near:   Left Eye Near:    Bilateral Near:     Physical Exam Vitals and nursing note reviewed.   Constitutional:      General: She is not in acute distress.    Appearance: Normal appearance.  Eyes:     Extraocular Movements: Extraocular movements intact.     Pupils: Pupils are equal, round, and reactive to light.  Cardiovascular:     Rate and Rhythm: Normal rate and regular rhythm.     Pulses: Normal pulses.     Heart sounds: Normal heart sounds.  Pulmonary:     Effort: Pulmonary effort is normal.     Breath sounds: Normal breath sounds.  Abdominal:     General: Bowel sounds are normal.     Palpations: Abdomen is soft.  Musculoskeletal:     Cervical back: Normal range of motion.     Right knee: Swelling present. No deformity. Normal range of motion. Tenderness present over the lateral joint line. Abnormal pulse.  Skin:    General: Skin is warm and dry.  Neurological:     General: No focal deficit present.     Mental Status: She is alert and oriented to person, place, and time.  Psychiatric:        Mood and Affect: Mood normal.        Behavior: Behavior normal.      UC Treatments / Results  Labs (all labs ordered are listed, but only abnormal results are displayed) Labs Reviewed - No data to display  EKG   Radiology DG Knee Complete 4 Views Right  Result Date: 07/13/2022 CLINICAL DATA:  Knee swelling EXAM: RIGHT KNEE - COMPLETE 4+ VIEW COMPARISON:  None Available. FINDINGS: No fracture or malalignment. Small knee effusion. Mild patellofemoral degenerative change. 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 Electronically Signed   By: Jasmine Pang M.D.   On: 07/13/2022 19:11    Procedures Procedures (including critical care time)  Medications Ordered in UC Medications - No data to display  Initial Impression / Assessment and Plan / UC Course  I have reviewed the triage vital signs and the nursing  notes.  Pertinent labs & imaging results that were available during my care of the patient were reviewed by me and considered in my  medical decision making (see chart for details).  The patient is well-appearing, she is in no acute distress, vital signs are stable.  Difficult to ascertain the cause of the patient's right lower extremity symptoms.  Patient does have degenerative changes with small effusion of the right knee, which most likely are causing the swelling that she is experiencing.  With regard to the numbness, would like for the patient to continue to monitor the symptoms.  Ace wrap was provided to help provide compression and support of the right knee.  Also recommended elevating the lower extremity above the level of the heart, continue use of her compression hose, and applying ice.  Patient was advised to also take over-the-counter analgesics such as ibuprofen for pain or discomfort.  Patient advised to continue to monitor her symptoms, and recommended that she follow-up with her primary care physician if symptoms worsen.  Patient is in agreement with this plan of care and verbalizes understanding.  All questions were answered.  Patient stable for discharge.  Final Clinical Impressions(s) / UC Diagnoses   Final diagnoses:  Pain of right lower extremity  Effusion, right knee     Discharge Instructions      The x-ray of the right knee is negative for fracture or dislocation, but does show degenerative changes along with fluid. Recommend over-the-counter Tylenol or ibuprofen as needed for pain, fever, or general discomfort. Wear the Ace wrap to provide compression and support of the right knee. Recommend elevating the lower extremities above the level of the heart is much as possible to help decrease swelling. Continue use of your compression hose. May use ice to the right knee to help with swelling.  Apply for 20 minutes, remove for 1 hour, then repeat as much as possible. If symptoms continue to persist, would like for you to follow-up with your primary care physician for further evaluation. Follow-up as  needed.     ED Prescriptions   None    PDMP not reviewed this encounter.   Abran Cantor, NP 07/13/22 1954

## 2023-04-10 ENCOUNTER — Emergency Department (HOSPITAL_COMMUNITY)
Admission: EM | Admit: 2023-04-10 | Discharge: 2023-04-11 | Disposition: A | Discharge status: Home / Self Care | Payer: Medicaid Other | Attending: Emergency Medicine | Admitting: Emergency Medicine

## 2023-04-10 ENCOUNTER — Other Ambulatory Visit: Payer: Self-pay

## 2023-04-10 ENCOUNTER — Encounter (HOSPITAL_COMMUNITY): Payer: Self-pay

## 2023-04-10 DIAGNOSIS — K011 Impacted teeth: Secondary | ICD-10-CM | POA: Diagnosis not present

## 2023-04-10 DIAGNOSIS — K0889 Other specified disorders of teeth and supporting structures: Secondary | ICD-10-CM | POA: Diagnosis present

## 2023-04-10 NOTE — ED Triage Notes (Addendum)
Reassessed pt vitals and her pain. Pain is 10/10. States pain is worse and she is not able to talk.

## 2023-04-10 NOTE — ED Triage Notes (Signed)
Pt states she is having dental pain in her left wisdom tooth. Pt has dentist appointment on feb 11th. Pt tearful in triage and is c/o intense pain.

## 2023-04-11 MED ORDER — OXYCODONE-ACETAMINOPHEN 5-325 MG PO TABS: 1.0000 | ORAL_TABLET | ORAL | 0 refills | Status: AC | PRN

## 2023-04-11 MED ORDER — OXYCODONE-ACETAMINOPHEN 5-325 MG PO TABS
1.0000 | ORAL_TABLET | Freq: Once | ORAL | Status: AC
  Administered 2023-04-11: 1 via ORAL
  Filled 2023-04-11: qty 1

## 2023-04-11 MED ORDER — IBUPROFEN 400 MG PO TABS
400.0000 mg | ORAL_TABLET | Freq: Once | ORAL | Status: AC
  Administered 2023-04-11: 400 mg via ORAL
  Filled 2023-04-11: qty 1

## 2023-04-11 MED ORDER — AMOXICILLIN 500 MG PO CAPS: 1000.0000 mg | ORAL_CAPSULE | Freq: Two times a day (BID) | ORAL | 0 refills | Status: AC

## 2023-04-11 MED ORDER — LOPERAMIDE HCL 2 MG PO CAPS: 4.0000 mg | ORAL_CAPSULE | Freq: Once | ORAL | Status: DC

## 2023-04-11 MED ORDER — OXYCODONE HCL 5 MG PO TABS: 5.0000 mg | ORAL_TABLET | ORAL | 0 refills | Status: AC | PRN

## 2023-04-11 MED ORDER — AMOXICILLIN 250 MG PO CAPS
1000.0000 mg | ORAL_CAPSULE | Freq: Once | ORAL | Status: AC
  Administered 2023-04-11: 1000 mg via ORAL
  Filled 2023-04-11: qty 4

## 2023-04-11 NOTE — Discharge Instructions (Addendum)
Take ibuprofen as needed for pain.  If you need additional pain relief, add acetaminophen.  If you still need additional pain relief, add oxycodone.  These medications all work on pain in different ways, and when taken together, give you better pain relief.  Called the on-call dentist to see if he can get you in to be seen sooner than your existing appointment for February 11.  It is very likely that what ever dentist sees you, will want to refer you to an oral surgeon.

## 2023-04-11 NOTE — ED Provider Notes (Signed)
[place] EMERGENCY DEPARTMENT AT [facility] Provider Note   CSN: [id number] Arrival date & time: 04/10/23  1918     History  Chief Complaint  Patient presents with   Left Dental Pain     Kim White is a 38 y.o. female.  The history is provided by the patient.  She has a history of asthma and comes in complaining of pain in both lower wisdom teeth, but worse on the left.  She has been having pain there intermittently for months to years, and pain was usually relieved with ibuprofen.  Today, pain got severe and was not relieved with ibuprofen.  She does have an appointment with a dentist on February 11.   Home Medications Prior to Admission medications   Medication Sig Start Date End Date Taking? Authorizing Provider  amoxicillin-clavulanate (AUGMENTIN) 875-125 MG tablet Take 1 tablet by mouth 2 (two) times daily. One po bid x 7 days 01/04/21   Pricilla Loveless, MD  ibuprofen (ADVIL) 200 MG tablet Take 200 mg by mouth every 6 (six) hours as needed for mild pain.    [provider]      Allergies    Other and Shellfish allergy    Review of Systems   Review of Systems  All other systems reviewed and are negative.   Physical Exam Updated Vital Signs BP (!) 126/100 (BP Location: Left Arm)   Pulse (!) 104   Temp 99.8 F (37.7 C) (Oral)   Resp (!) 23   SpO2 100%  Physical Exam Vitals and nursing note reviewed.   38 year old female, appears uncomfortable, but is in no acute distress. Vital signs are significant for mildly elevated heart rate and respiratory rate and elevated diastolic blood pressure. Oxygen saturation is 100%, which is normal. Head is normocephalic and atraumatic. PERRLA, EOMI. There are impacted lower third molars.  Bilaterally (teeth #17 and 32).  There is some gingival swelling around tooth #17 and there is tenderness to percussion over tooth #17.  Teeth #1 and 16 are nontender but are definitely crowded. Neck is nontender and  supple without adenopathy. Lungs are clear without rales, wheezes, or rhonchi. Chest is nontender. Heart has regular rate and rhythm without murmur. Abdomen is soft, flat, nontender. Neurologic: Mental status is normal, moves all extremities equally.  ED Results / Procedures / Treatments    Procedures Procedures    Medications Ordered in ED Medications  amoxicillin (AMOXIL) capsule 1,000 mg (has no administration in time range)  oxyCODONE-acetaminophen (PERCOCET/ROXICET) 5-325 MG per tablet 1 tablet (has no administration in time range)  ibuprofen (ADVIL) tablet 400 mg (has no administration in time range)    ED Course/ Medical Decision Making/ A&P                                 Medical Decision Making  Impacted third molars.  I have ordered an initial dose of amoxicillin and I have also ordered a dose of ibuprofen and oxycodone-acetaminophen.  I am discharging her with prescription for amoxicillin and oxycodone, told to use over-the-counter ibuprofen and acetaminophen as her primary pain control.  I am referring her to the on-call dentist to see if she can be seen more promptly.  I suspect she will need referral to an oral surgeon.  Final Clinical Impression(s) / ED Diagnoses Final diagnoses:  Impacted third molar tooth    Rx / DC Orders ED Discharge Orders  Ordered    oxyCODONE (ROXICODONE) 5 MG immediate release tablet  Every 4 hours PRN        04/11/23 0036    oxyCODONE-acetaminophen (PERCOCET) 5-325 MG tablet  Every 4 hours PRN        04/11/23 0038    amoxicillin (AMOXIL) 500 MG capsule  2 times daily        04/11/23 0043              Dione Booze, MD 04/11/23 [phone]

## 2023-04-12 MED FILL — Oxycodone w/ Acetaminophen Tab 5-325 MG: ORAL | Qty: 6 | Status: AC

## 2023-08-18 ENCOUNTER — Encounter (HOSPITAL_COMMUNITY): Payer: Self-pay

## 2023-08-18 ENCOUNTER — Emergency Department (HOSPITAL_COMMUNITY)
Admission: EM | Admit: 2023-08-18 | Discharge: 2023-08-18 | Disposition: A | Discharge status: Home / Self Care | Attending: Emergency Medicine | Admitting: Emergency Medicine

## 2023-08-18 ENCOUNTER — Other Ambulatory Visit: Payer: Self-pay

## 2023-08-18 DIAGNOSIS — Z111 Encounter for screening for respiratory tuberculosis: Secondary | ICD-10-CM

## 2023-08-18 DIAGNOSIS — R7611 Nonspecific reaction to tuberculin skin test without active tuberculosis: Secondary | ICD-10-CM | POA: Diagnosis not present

## 2023-08-18 NOTE — Discharge Instructions (Signed)
 Turn to the emergency department for any new or worsening symptoms.

## 2023-08-18 NOTE — ED Provider Notes (Signed)
 [place] EMERGENCY DEPARTMENT AT [facility] Provider Note   CSN: [id number] Arrival date & time: 08/18/23  1243     History  Chief Complaint  Patient presents with   PPD Reading    Kim White is a 38 y.o. female.  Patient is a 38 year old female who presents emergency department stating that she needs her PPD test read.  Patient did have it placed 48 hours ago.  Patient has no other acute complaints at this point.        Home Medications Prior to Admission medications   Medication Sig Start Date End Date Taking? Authorizing Provider  amoxicillin  (AMOXIL ) 500 MG capsule Take 2 capsules (1,000 mg total) by mouth 2 (two) times daily. 04/11/23   Alissa April, MD  ibuprofen  (ADVIL ) 200 MG tablet Take 200 mg by mouth every 6 (six) hours as needed for mild pain.    [provider]  oxyCODONE  (ROXICODONE ) 5 MG immediate release tablet Take 1 tablet (5 mg total) by mouth every 4 (four) hours as needed for severe pain (pain score 7-10). 04/11/23   Alissa April, MD  oxyCODONE -acetaminophen  (PERCOCET) 5-325 MG tablet Take 1 tablet by mouth every 4 (four) hours as needed for moderate pain (pain score 4-6). 04/11/23   Alissa April, MD      Allergies    Other and Shellfish allergy    Review of Systems   Review of Systems  Skin:        PPD test read  All other systems reviewed and are negative.   Physical Exam Updated Vital Signs BP (!) 134/99 (BP Location: Right Arm)   Temp 98.5 F (36.9 C) (Oral)   Resp 16   Ht 5\' 2"  (1.575 m)   Wt 106.1 kg   LMP 08/15/2023   SpO2 99%   BMI 42.80 kg/m  Physical Exam Vitals and nursing note reviewed.  Constitutional:      Appearance: Normal appearance.  Eyes:     Extraocular Movements: Extraocular movements intact.     Conjunctiva/sclera: Conjunctivae normal.     Pupils: Pupils are equal, round, and reactive to light.  Cardiovascular:     Rate and Rhythm: Normal rate and regular rhythm.     Pulses: Normal  pulses.  Pulmonary:     Effort: Pulmonary effort is normal. No respiratory distress.  Musculoskeletal:        General: Normal range of motion.  Skin:    General: Skin is warm and dry.     Comments: No induration noted to the right forearm at the site of the PPD test  Neurological:     General: No focal deficit present.     Mental Status: She is alert and oriented to person, place, and time. Mental status is at baseline.     ED Results / Procedures / Treatments   Labs (all labs ordered are listed, but only abnormal results are displayed) Labs Reviewed - No data to display  EKG None  Radiology No results found.  Procedures Procedures    Medications Ordered in ED Medications - No data to display  ED Course/ Medical Decision Making/ A&P                                 Medical Decision Making Patient is stable for discharge home at this time.  Patient's PPD test is negative at this point.  There is no induration at the site.  Forms were filled out for the patient.           Final Clinical Impression(s) / ED Diagnoses Final diagnoses:  Encounter for PPD skin test reading    Rx / DC Orders ED Discharge Orders     None         Roselynn Connors, PA-C 08/18/23 1343    Jerilynn Montenegro, MD 08/21/23 [phone]

## 2023-08-18 NOTE — ED Triage Notes (Signed)
 Pt had PPD test placed on Junes 4th at a [facility] facility for employment and she needs it read.
